# Patient Record
Sex: Female | Born: 1985 | Race: Black or African American | Hispanic: No | Marital: Single | Smoking: Current every day smoker
Health system: Southern US, Community
[De-identification: ages and names within clinical notes are randomized; demographics above are authoritative.]

## PROBLEM LIST (undated history)

## (undated) ENCOUNTER — Inpatient Hospital Stay: Payer: Self-pay

## (undated) DIAGNOSIS — L309 Dermatitis, unspecified: Secondary | ICD-10-CM

## (undated) DIAGNOSIS — J45909 Unspecified asthma, uncomplicated: Secondary | ICD-10-CM

## (undated) DIAGNOSIS — O99210 Obesity complicating pregnancy, unspecified trimester: Secondary | ICD-10-CM

## (undated) HISTORY — PX: NO PAST SURGERIES: SHX2092

---

## 2009-01-06 ENCOUNTER — Emergency Department: Payer: Self-pay | Admitting: Unknown Physician Specialty

## 2009-01-18 ENCOUNTER — Encounter: Payer: Self-pay | Admitting: Family Medicine

## 2009-01-19 ENCOUNTER — Encounter: Payer: Self-pay | Admitting: Family Medicine

## 2009-11-23 ENCOUNTER — Emergency Department: Payer: Self-pay | Admitting: Unknown Physician Specialty

## 2009-12-15 ENCOUNTER — Emergency Department: Payer: Self-pay | Admitting: Internal Medicine

## 2010-03-04 ENCOUNTER — Emergency Department: Payer: Self-pay | Admitting: Emergency Medicine

## 2010-03-06 ENCOUNTER — Emergency Department: Payer: Self-pay | Admitting: Emergency Medicine

## 2010-06-01 ENCOUNTER — Emergency Department: Payer: Self-pay | Admitting: Emergency Medicine

## 2010-07-18 ENCOUNTER — Emergency Department: Payer: Self-pay | Admitting: Emergency Medicine

## 2010-08-24 ENCOUNTER — Inpatient Hospital Stay (HOSPITAL_COMMUNITY)
Admission: AD | Admit: 2010-08-24 | Discharge: 2010-08-24 | Disposition: A | Discharge status: Home / Self Care | Payer: Self-pay | Source: Ambulatory Visit | Attending: Obstetrics & Gynecology | Admitting: Obstetrics & Gynecology

## 2010-08-24 DIAGNOSIS — R110 Nausea: Secondary | ICD-10-CM | POA: Insufficient documentation

## 2010-08-24 LAB — URINALYSIS, ROUTINE W REFLEX MICROSCOPIC
Glucose, UA: NEGATIVE mg/dL
Specific Gravity, Urine: 1.025 (ref 1.005–1.030)
pH: 7 (ref 5.0–8.0)

## 2010-09-16 ENCOUNTER — Emergency Department (HOSPITAL_COMMUNITY)
Admission: EM | Admit: 2010-09-16 | Discharge: 2010-09-16 | Disposition: A | Discharge status: Home / Self Care | Payer: Self-pay | Attending: Emergency Medicine | Admitting: Emergency Medicine

## 2010-09-16 DIAGNOSIS — N644 Mastodynia: Secondary | ICD-10-CM | POA: Insufficient documentation

## 2010-09-16 DIAGNOSIS — M545 Low back pain, unspecified: Secondary | ICD-10-CM | POA: Insufficient documentation

## 2010-09-16 LAB — URINALYSIS, ROUTINE W REFLEX MICROSCOPIC
Bilirubin Urine: NEGATIVE
Nitrite: NEGATIVE
Specific Gravity, Urine: 1.019 (ref 1.005–1.030)
Urobilinogen, UA: 0.2 mg/dL (ref 0.0–1.0)

## 2010-09-16 LAB — URINE MICROSCOPIC-ADD ON

## 2010-09-16 LAB — PREGNANCY, URINE: Preg Test, Ur: NEGATIVE

## 2010-09-30 ENCOUNTER — Emergency Department (HOSPITAL_COMMUNITY): Payer: Self-pay

## 2010-09-30 ENCOUNTER — Emergency Department (HOSPITAL_COMMUNITY)
Admission: EM | Admit: 2010-09-30 | Discharge: 2010-09-30 | Disposition: A | Discharge status: Home / Self Care | Payer: Self-pay | Attending: Emergency Medicine | Admitting: Emergency Medicine

## 2010-09-30 DIAGNOSIS — S3210XA Unspecified fracture of sacrum, initial encounter for closed fracture: Secondary | ICD-10-CM | POA: Insufficient documentation

## 2010-09-30 DIAGNOSIS — M549 Dorsalgia, unspecified: Secondary | ICD-10-CM | POA: Insufficient documentation

## 2010-09-30 DIAGNOSIS — X58XXXA Exposure to other specified factors, initial encounter: Secondary | ICD-10-CM | POA: Insufficient documentation

## 2010-09-30 DIAGNOSIS — R109 Unspecified abdominal pain: Secondary | ICD-10-CM | POA: Insufficient documentation

## 2010-09-30 LAB — URINALYSIS, ROUTINE W REFLEX MICROSCOPIC
Glucose, UA: NEGATIVE mg/dL
Hgb urine dipstick: NEGATIVE
Protein, ur: NEGATIVE mg/dL

## 2010-11-02 ENCOUNTER — Emergency Department: Payer: Self-pay | Admitting: Emergency Medicine

## 2010-11-28 ENCOUNTER — Emergency Department (HOSPITAL_COMMUNITY)
Admission: EM | Admit: 2010-11-28 | Discharge: 2010-11-28 | Disposition: A | Discharge status: Home / Self Care | Payer: Self-pay | Attending: Emergency Medicine | Admitting: Emergency Medicine

## 2010-11-28 DIAGNOSIS — J039 Acute tonsillitis, unspecified: Secondary | ICD-10-CM | POA: Insufficient documentation

## 2010-11-28 DIAGNOSIS — R070 Pain in throat: Secondary | ICD-10-CM | POA: Insufficient documentation

## 2010-11-28 DIAGNOSIS — J351 Hypertrophy of tonsils: Secondary | ICD-10-CM | POA: Insufficient documentation

## 2010-11-28 DIAGNOSIS — R599 Enlarged lymph nodes, unspecified: Secondary | ICD-10-CM | POA: Insufficient documentation

## 2010-11-28 LAB — POCT PREGNANCY, URINE: Preg Test, Ur: NEGATIVE

## 2010-12-16 ENCOUNTER — Emergency Department (HOSPITAL_COMMUNITY)
Admission: EM | Admit: 2010-12-16 | Discharge: 2010-12-16 | Disposition: A | Discharge status: Home / Self Care | Payer: Self-pay | Attending: Emergency Medicine | Admitting: Emergency Medicine

## 2010-12-16 DIAGNOSIS — L02419 Cutaneous abscess of limb, unspecified: Secondary | ICD-10-CM | POA: Insufficient documentation

## 2010-12-16 DIAGNOSIS — L03119 Cellulitis of unspecified part of limb: Secondary | ICD-10-CM | POA: Insufficient documentation

## 2011-01-02 ENCOUNTER — Emergency Department: Payer: Self-pay | Admitting: Emergency Medicine

## 2011-05-20 ENCOUNTER — Emergency Department: Payer: Self-pay | Admitting: *Deleted

## 2011-06-20 ENCOUNTER — Emergency Department: Payer: Self-pay | Admitting: Emergency Medicine

## 2012-03-25 IMAGING — CR DG SACRUM/COCCYX 2+V
3 series · 3 of 3 positions shown · non-contrast
Comparison: None.

CLINICAL DATA: Back pain.

SACRUM AND COCCYX - 2+ VIEW

[t sacrum a.p.]
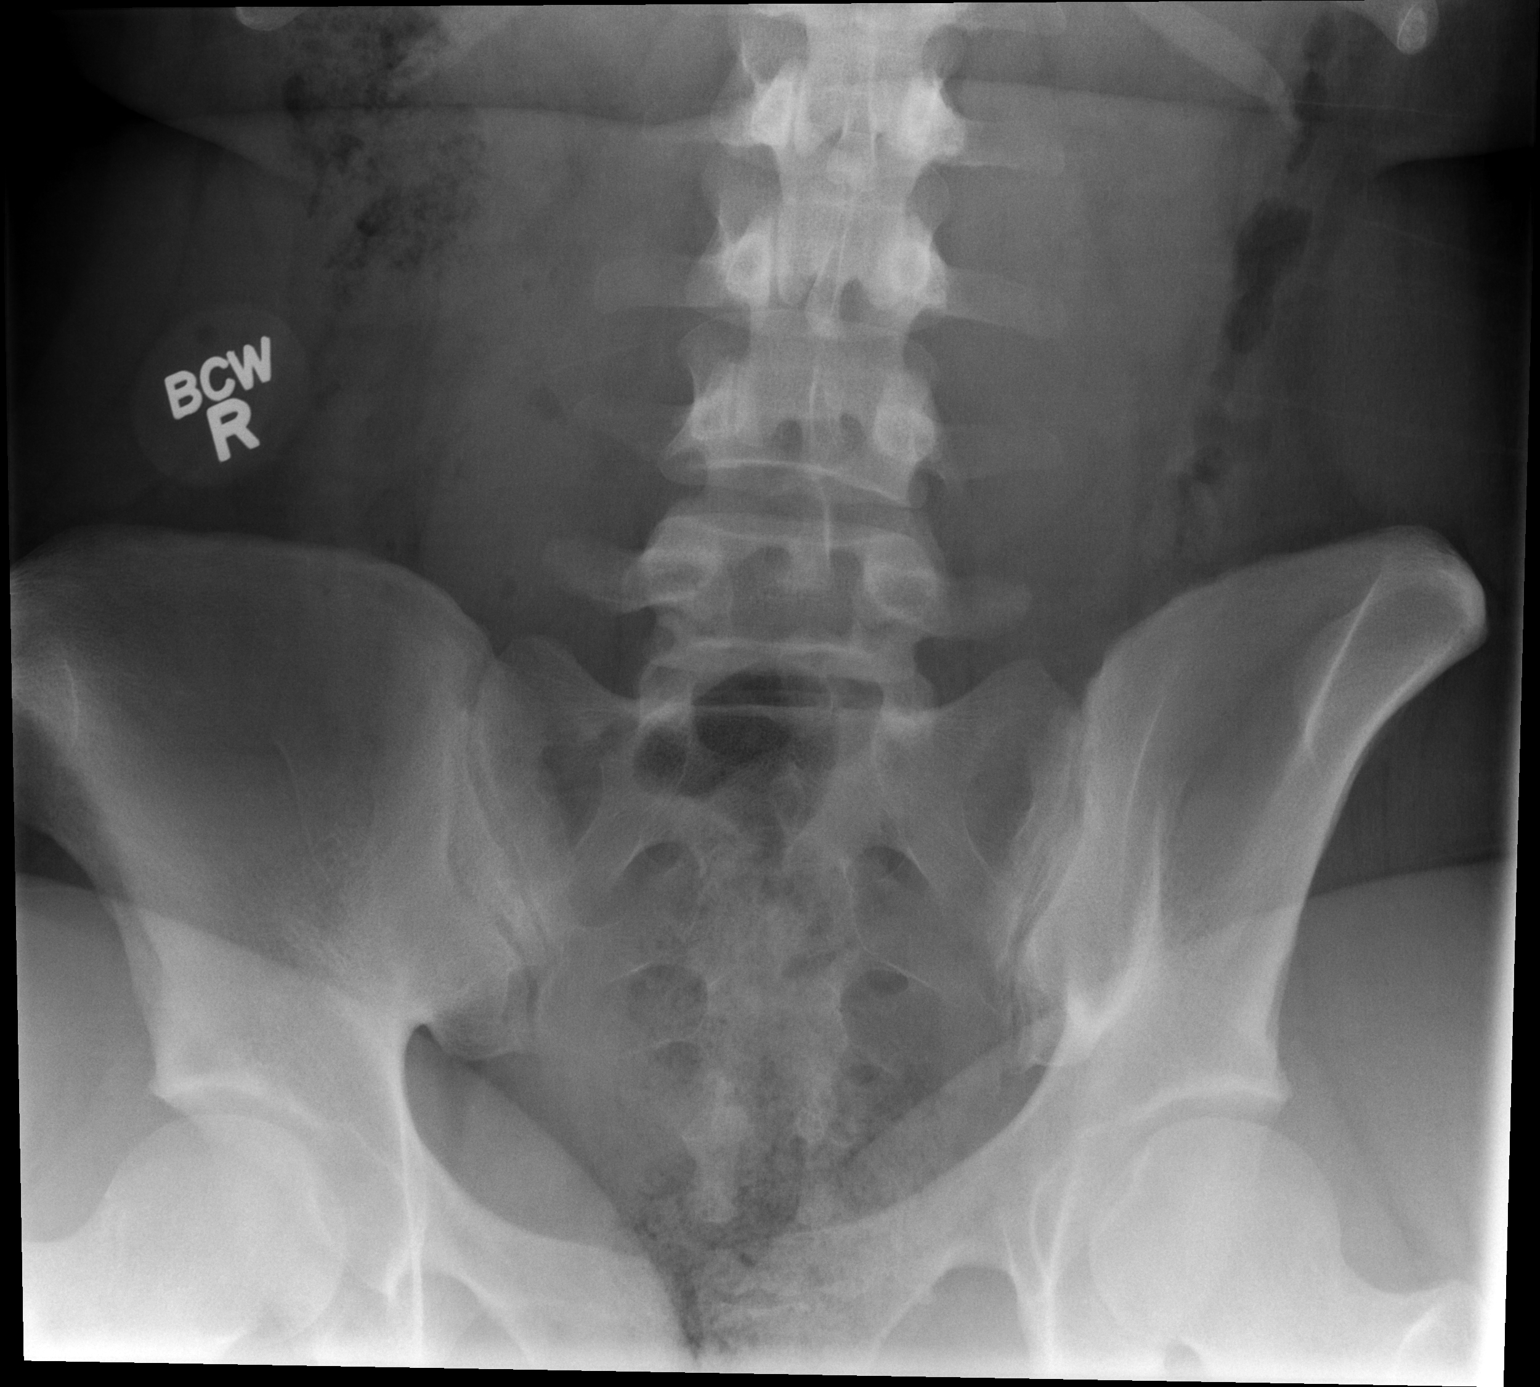

[t coccyx a.p.]
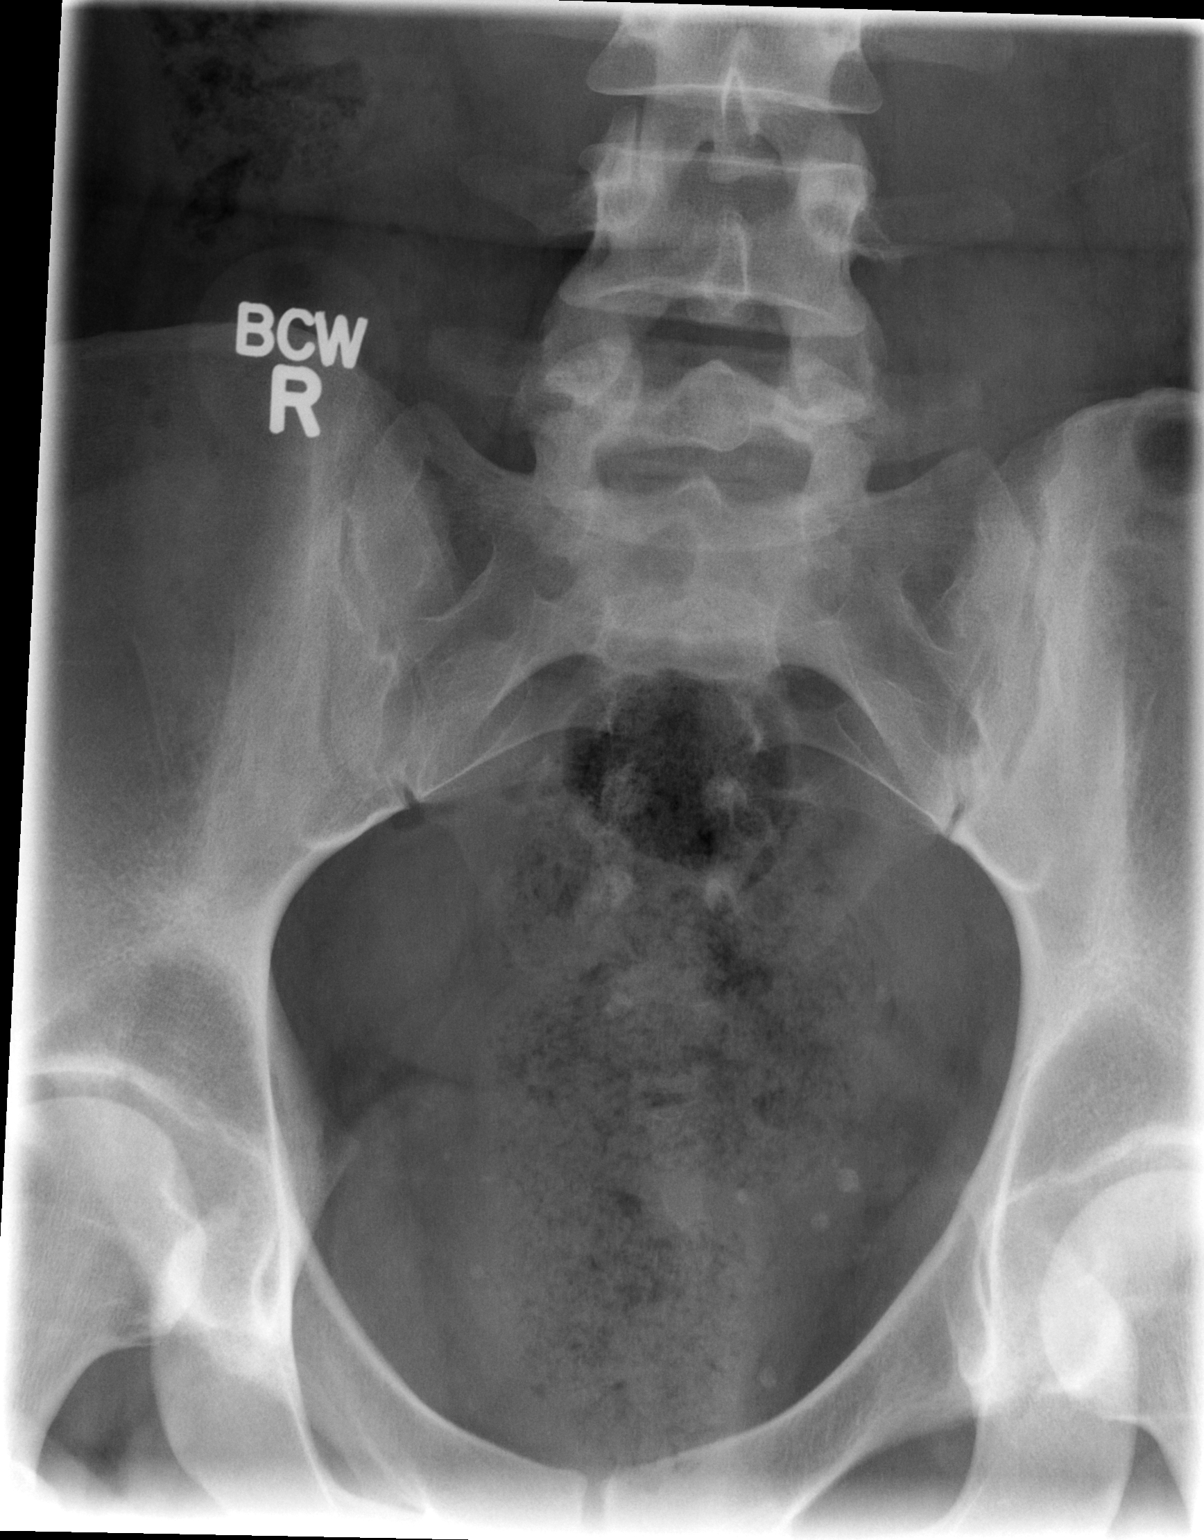

[t sacrum lat]
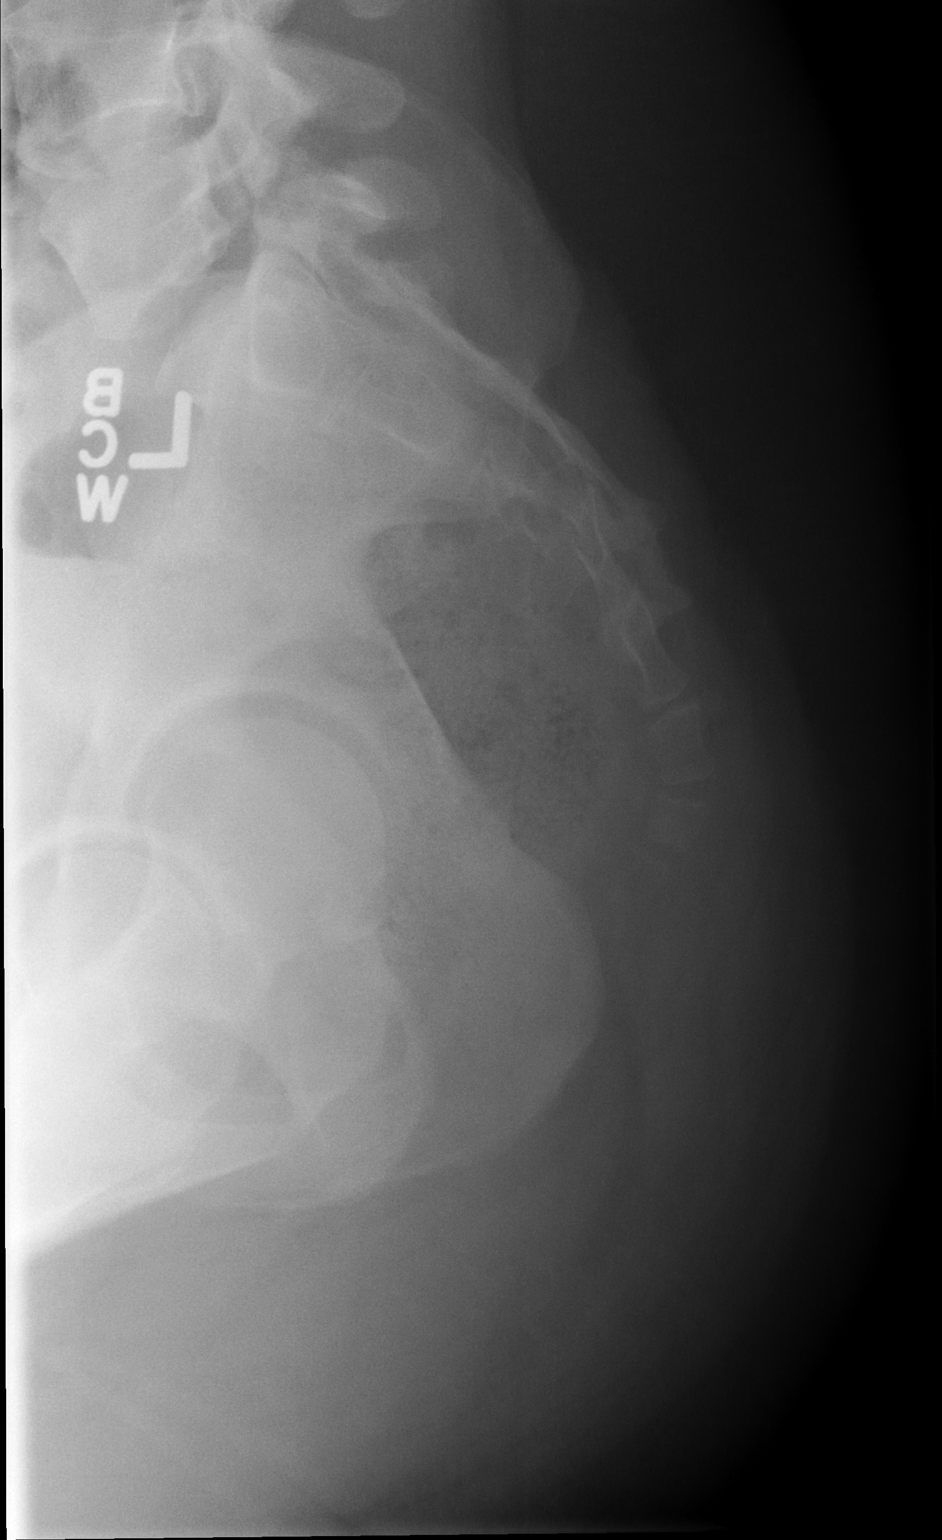

[3 of 3 positions shown; findings below may reference images not displayed]

FINDINGS: On the frontal view, the sacrum is largely obscured by
bowel contents.  On the lateral view, there is questionable
cortical offset in the lower sacrum.
IMPRESSION: Very questionable cortical offset of the lower sacrum.  Please
correlate clinically.  If symptoms persist, follow-up evaluation
with MR pelvis without contrast could be performed.

## 2013-11-02 ENCOUNTER — Emergency Department: Payer: Self-pay | Admitting: Emergency Medicine

## 2013-11-07 ENCOUNTER — Emergency Department: Payer: Self-pay | Admitting: Emergency Medicine

## 2014-01-01 ENCOUNTER — Emergency Department: Payer: Self-pay | Admitting: Emergency Medicine

## 2014-01-01 LAB — WET PREP, GENITAL

## 2014-01-01 LAB — GC/CHLAMYDIA PROBE AMP

## 2014-02-05 ENCOUNTER — Emergency Department: Payer: Self-pay | Admitting: Student

## 2014-04-21 NOTE — L&D Delivery Note (Addendum)
Obstetrical Delivery Note   Date of Delivery:   12/19/2014 Primary OB:   [facility] Gestational Age/EDD: [redacted]w[redacted]d (Dated by LMP=18 week ultrasound) Antepartum complications: gestational HTN, insufficient prenatal care, history of THC use during pregnancy, BMI 36  Delivered By:   Cornelia Copa. MD  Delivery Type:   spontaneous vaginal delivery  Delivery Details:   CTSP for fetus crowning and upon entry into room, fetus crowning with RN at the bedside. Patient pushed and nuchal cord x 1 reduced and rest of body then delivered easily. Cord cut and clamped x 2 and handed to awaiting Peds Team.  Placenta out via active third stage and no laceration.  Anesthesia:    epidural Intrapartum complications: None GBS:    Unknown; swab from 8/29 pending and patient received ampicillin x 2 during labor Laceration:    none Episiotomy:    none Placenta:    Delivered and expressed via active management. Intact: yes. To pathology: yes.  Estimated Blood Loss:   Baby:    Liveborn female, APGARs 8/9, weight 3800gm  Cornelia Copa. MD [employer] Pager [phone]

## 2014-05-05 ENCOUNTER — Emergency Department: Payer: Self-pay | Admitting: Emergency Medicine

## 2014-05-05 LAB — WET PREP, GENITAL

## 2014-05-05 LAB — OB RESULTS CONSOLE GC/CHLAMYDIA
Chlamydia: NEGATIVE
Gonorrhea: NEGATIVE

## 2014-05-05 LAB — GC/CHLAMYDIA PROBE AMP

## 2014-07-26 LAB — OB RESULTS CONSOLE RUBELLA ANTIBODY, IGM: RUBELLA: IMMUNE

## 2014-07-26 LAB — OB RESULTS CONSOLE HEPATITIS B SURFACE ANTIGEN: Hepatitis B Surface Ag: NEGATIVE

## 2014-07-26 LAB — OB RESULTS CONSOLE VARICELLA ZOSTER ANTIBODY, IGG: Varicella: IMMUNE

## 2014-07-26 LAB — OB RESULTS CONSOLE HIV ANTIBODY (ROUTINE TESTING): HIV: NONREACTIVE

## 2014-07-26 LAB — OB RESULTS CONSOLE RPR: RPR: NONREACTIVE

## 2014-08-25 ENCOUNTER — Other Ambulatory Visit: Payer: Self-pay | Admitting: Obstetrics and Gynecology

## 2014-08-28 ENCOUNTER — Other Ambulatory Visit: Payer: Self-pay | Admitting: Obstetrics and Gynecology

## 2014-08-28 DIAGNOSIS — IMO0002 Reserved for concepts with insufficient information to code with codable children: Secondary | ICD-10-CM

## 2014-08-28 DIAGNOSIS — O351XX0 Maternal care for (suspected) chromosomal abnormality in fetus, not applicable or unspecified: Secondary | ICD-10-CM

## 2014-09-04 ENCOUNTER — Ambulatory Visit: Payer: Self-pay

## 2014-09-07 ENCOUNTER — Ambulatory Visit: Payer: Self-pay

## 2014-09-14 ENCOUNTER — Ambulatory Visit
Admission: RE | Admit: 2014-09-14 | Discharge: 2014-09-14 | Disposition: A | Discharge status: Home / Self Care | Payer: Medicaid Other | Source: Ambulatory Visit | Attending: Maternal & Fetal Medicine | Admitting: Maternal & Fetal Medicine

## 2014-09-14 ENCOUNTER — Ambulatory Visit
Admission: RE | Admit: 2014-09-14 | Discharge: 2014-09-14 | Disposition: A | Discharge status: Home / Self Care | Payer: Medicaid Other | Source: Ambulatory Visit | Attending: Obstetrics and Gynecology | Admitting: Obstetrics and Gynecology

## 2014-09-14 DIAGNOSIS — O351XX0 Maternal care for (suspected) chromosomal abnormality in fetus, not applicable or unspecified: Secondary | ICD-10-CM | POA: Diagnosis present

## 2014-09-14 DIAGNOSIS — IMO0002 Reserved for concepts with insufficient information to code with codable children: Secondary | ICD-10-CM

## 2014-09-14 LAB — US OB DETAIL + 14 WK

## 2014-10-18 LAB — OB RESULTS CONSOLE RPR: RPR: NONREACTIVE

## 2014-10-18 LAB — OB RESULTS CONSOLE HIV ANTIBODY (ROUTINE TESTING): HIV: NONREACTIVE

## 2014-12-18 ENCOUNTER — Encounter: Payer: Self-pay | Admitting: *Deleted

## 2014-12-18 ENCOUNTER — Observation Stay
Admission: EM | Admit: 2014-12-18 | Discharge: 2014-12-18 | Disposition: A | Discharge status: Home / Self Care | Payer: Medicaid Other | Source: Home / Self Care | Admitting: Obstetrics & Gynecology

## 2014-12-18 DIAGNOSIS — Z3A39 39 weeks gestation of pregnancy: Secondary | ICD-10-CM | POA: Insufficient documentation

## 2014-12-18 DIAGNOSIS — O99214 Obesity complicating childbirth: Principal | ICD-10-CM | POA: Diagnosis present

## 2014-12-18 DIAGNOSIS — D693 Immune thrombocytopenic purpura: Secondary | ICD-10-CM | POA: Diagnosis present

## 2014-12-18 DIAGNOSIS — O133 Gestational [pregnancy-induced] hypertension without significant proteinuria, third trimester: Secondary | ICD-10-CM | POA: Diagnosis present

## 2014-12-18 DIAGNOSIS — O0933 Supervision of pregnancy with insufficient antenatal care, third trimester: Secondary | ICD-10-CM

## 2014-12-18 DIAGNOSIS — O26893 Other specified pregnancy related conditions, third trimester: Secondary | ICD-10-CM

## 2014-12-18 DIAGNOSIS — O169 Unspecified maternal hypertension, unspecified trimester: Secondary | ICD-10-CM | POA: Diagnosis present

## 2014-12-18 DIAGNOSIS — O99320 Drug use complicating pregnancy, unspecified trimester: Secondary | ICD-10-CM | POA: Diagnosis present

## 2014-12-18 DIAGNOSIS — Z87891 Personal history of nicotine dependence: Secondary | ICD-10-CM

## 2014-12-18 DIAGNOSIS — Z8261 Family history of arthritis: Secondary | ICD-10-CM

## 2014-12-18 DIAGNOSIS — R030 Elevated blood-pressure reading, without diagnosis of hypertension: Secondary | ICD-10-CM | POA: Insufficient documentation

## 2014-12-18 DIAGNOSIS — Z6833 Body mass index (BMI) 33.0-33.9, adult: Secondary | ICD-10-CM

## 2014-12-18 DIAGNOSIS — Z79899 Other long term (current) drug therapy: Secondary | ICD-10-CM

## 2014-12-18 DIAGNOSIS — F1290 Cannabis use, unspecified, uncomplicated: Secondary | ICD-10-CM | POA: Diagnosis present

## 2014-12-18 LAB — COMPREHENSIVE METABOLIC PANEL
ALBUMIN: 2.7 g/dL — AB (ref 3.5–5.0)
ALT: 22 U/L (ref 14–54)
ANION GAP: 6 (ref 5–15)
AST: 24 U/L (ref 15–41)
Alkaline Phosphatase: 170 U/L — ABNORMAL HIGH (ref 38–126)
BILIRUBIN TOTAL: 0.7 mg/dL (ref 0.3–1.2)
BUN: 6 mg/dL (ref 6–20)
CO2: 23 mmol/L (ref 22–32)
Calcium: 8.7 mg/dL — ABNORMAL LOW (ref 8.9–10.3)
Chloride: 104 mmol/L (ref 101–111)
Creatinine, Ser: 0.7 mg/dL (ref 0.44–1.00)
GFR calc Af Amer: >60 mL/min (ref >60)
GFR calc non Af Amer: >60 mL/min (ref >60)
GLUCOSE: 87 mg/dL (ref 65–99)
POTASSIUM: 4.2 mmol/L (ref 3.5–5.1)
SODIUM: 133 mmol/L — AB (ref 135–145)
TOTAL PROTEIN: 6.8 g/dL (ref 6.5–8.1)

## 2014-12-18 LAB — URINE DRUG SCREEN, QUALITATIVE (ARMC ONLY)
Amphetamines, Ur Screen: NOT DETECTED
BARBITURATES, UR SCREEN: NOT DETECTED
BENZODIAZEPINE, UR SCRN: NOT DETECTED
Cannabinoid 50 Ng, Ur ~~LOC~~: POSITIVE — AB
Cocaine Metabolite,Ur ~~LOC~~: NOT DETECTED
MDMA (Ecstasy)Ur Screen: NOT DETECTED
METHADONE SCREEN, URINE: NOT DETECTED
Opiate, Ur Screen: NOT DETECTED
Phencyclidine (PCP) Ur S: NOT DETECTED
TRICYCLIC, UR SCREEN: NOT DETECTED

## 2014-12-18 LAB — CBC
HEMATOCRIT: 37.2 % (ref 35.0–47.0)
Hemoglobin: 12.3 g/dL (ref 12.0–16.0)
MCH: 29.6 pg (ref 26.0–34.0)
MCHC: 33 g/dL (ref 32.0–36.0)
MCV: 89.8 fL (ref 80.0–100.0)
PLATELETS: 282 10*3/uL (ref 150–440)
RBC: 4.14 MIL/uL (ref 3.80–5.20)
RDW: 14.2 % (ref 11.5–14.5)
WBC: 14.3 10*3/uL — AB (ref 3.6–11.0)

## 2014-12-18 LAB — URIC ACID: URIC ACID, SERUM: 3.9 mg/dL (ref 2.3–6.6)

## 2014-12-18 LAB — PROTEIN / CREATININE RATIO, URINE
CREATININE, URINE: 264 mg/dL
PROTEIN CREATININE RATIO: 0.09 mg/mg{creat} (ref 0.00–0.15)
Total Protein, Urine: 23 mg/dL

## 2014-12-18 MED ORDER — ACETAMINOPHEN 325 MG PO TABS: 650.0000 mg | ORAL_TABLET | ORAL | Status: DC | PRN

## 2014-12-18 NOTE — Discharge Instructions (Signed)
Make sure you drink plenty of fluids and get plenty of rest.   Please follow up with [facility].

## 2014-12-18 NOTE — Discharge Summary (Signed)
Obstetric History and Physical  Laurie Stewart is a 29 y.o. G2P0 with Estimated Date of Delivery: 12/25/14 per LMP at 18 wk [place] who presents at [redacted]w[redacted]d  presenting for evaluation of BP after BP of 140/90 and 143/98 at office today. Patient states she has been having rare contractions, no vaginal bleeding, intact membranes, with active fetal movement.    Prenatal Course Source of Care: [facility] with onset of care at 15 weeks Pregnancy complications or risks: BMI 33, late to care with lapse of care between 30-39 weeks (pt states she kept forgetting or sleeping through appts d/t working night shift.) Slightly increased Nuchal fold - negative informaseq Patient Active Problem List   Diagnosis Date Noted  . Hypertension affecting pregnancy 12/18/2014    Prenatal labs and studies: ABO, Rh: AB+  Antibody: neg Rubella: Immune Varicella: Immune RPR:  Non-reactive HBsAg:  Negative HIV: Neg GC/CT: neg/neg GBS: drawn today 1 hr Glucola: 70   Genetic screening: negative Informaseq    Prenatal Transfer Tool   PMHx: Eczema  History reviewed. No pertinent past surgical history.  OB History  Gravida Para Term Preterm AB SAB TAB Ectopic Multiple Living  2         1    # Outcome Date GA Lbr Len/2nd Weight Sex Delivery Anes PTL Lv  2 Current           1 Gravida 09/21/02    M Vag-Spont  N Y      Social History   Social History  . Marital Status: Single    Spouse Name: N/A  . Number of Children: N/A  . Years of Education: N/A   Social History Main Topics  . Smoking status: Former Smoker    Quit date: 05/19/2014  . Smokeless tobacco: None  . Alcohol Use: No  . Drug Use: No  . Sexual Activity: Yes    Birth Control/ Protection: Injection   Other Topics Concern  . None   Social History Narrative    Family History  Problem Relation Age of Onset  . Arthritis Father     Prescriptions prior to admission  Medication Sig Dispense Refill Last Dose  . Prenatal Vit-Fe Fumarate-FA  (MULTIVITAMIN-PRENATAL) 27-0.8 MG TABS tablet Take 1 tablet by mouth daily at 12 noon.     Prevacid  No Known Allergies  Review of Systems: Negative except for what is mentioned in HPI.  Physical Exam: BP 109-137/60-94, Diastolic >90 x 2, otherwise all values normotensive  Pulse 73 GENERAL: Well-developed, well-nourished female in no acute distress.  ABDOMEN: Soft, nontender, nondistended, gravid. EXTREMITIES: Nontender, no edema Cervical Exam: Dilatation 2 cm   Effacement 50 %   Station high,    Presentation: cephalic confirmed with bedside US FHT: Category: 1 Baseline rate 135 bpm   Variability moderate  Accelerations present   Decelerations none Contractions: Every rare mins   Pertinent Labs/Studies:   Results for orders placed or performed during the hospital encounter of 12/18/14 (from the past 24 hour(s))  Protein / creatinine ratio, urine     Status: None   Collection Time: 12/18/14  1:00 PM  Result Value Ref Range   Creatinine, Urine 264 mg/dL   Total Protein, Urine 23 mg/dL   Protein Creatinine Ratio 0.09 0.00 - 0.15 mg/mg[Cre]  Urine Drug Screen, Qualitative ([facility] only)     Status: Abnormal   Collection Time: 12/18/14  1:00 PM  Result Value Ref Range   Tricyclic, Ur Screen NONE DETECTED NONE DETECTED  Amphetamines, Ur Screen NONE DETECTED NONE DETECTED   MDMA (Ecstasy)Ur Screen NONE DETECTED NONE DETECTED   Cocaine Metabolite,Ur [place] NONE DETECTED NONE DETECTED   Opiate, Ur Screen NONE DETECTED NONE DETECTED   Phencyclidine (PCP) Ur S NONE DETECTED NONE DETECTED   Cannabinoid 50 Ng, Ur [place] POSITIVE (A) NONE DETECTED   Barbiturates, Ur Screen NONE DETECTED NONE DETECTED   Benzodiazepine, Ur Scrn NONE DETECTED NONE DETECTED   Methadone Scn, Ur NONE DETECTED NONE DETECTED  Comprehensive metabolic panel     Status: Abnormal   Collection Time: 12/18/14  1:18 PM  Result Value Ref Range   Sodium 133 (L) 135 - 145 mmol/L   Potassium 4.2 3.5 - 5.1 mmol/L   Chloride 104 101  - 111 mmol/L   CO2 23 22 - 32 mmol/L   Glucose, Bld 87 65 - 99 mg/dL   BUN 6 6 - 20 mg/dL   Creatinine, Ser 0.98 0.44 - 1.00 mg/dL   Calcium 8.7 (L) 8.9 - 10.3 mg/dL   Total Protein 6.8 6.5 - 8.1 g/dL   Albumin 2.7 (L) 3.5 - 5.0 g/dL   AST 24 15 - 41 U/L   ALT 22 14 - 54 U/L   Alkaline Phosphatase 170 (H) 38 - 126 U/L   Total Bilirubin 0.7 0.3 - 1.2 mg/dL   GFR calc non Af Amer >60 >60 mL/min   GFR calc Af Amer >60 >60 mL/min   Anion gap 6 5 - 15  CBC     Status: Abnormal   Collection Time: 12/18/14  1:18 PM  Result Value Ref Range   WBC 14.3 (H) 3.6 - 11.0 K/uL   RBC 4.14 3.80 - 5.20 MIL/uL   Hemoglobin 12.3 12.0 - 16.0 g/dL   HCT 11.9 14.7 - 82.9 %   MCV 89.8 80.0 - 100.0 fL   MCH 29.6 26.0 - 34.0 pg   MCHC 33.0 32.0 - 36.0 g/dL   RDW 56.2 13.0 - 86.5 %   Platelets 282 150 - 440 K/uL  Uric acid     Status: None   Collection Time: 12/18/14  1:18 PM  Result Value Ref Range   Uric Acid, Serum 3.9 2.3 - 6.6 mg/dL    Assessment : IUP at [redacted]w[redacted]d without evidence of GHTN or pre-eclampsia  Plan: Discharge Home  F/u in office on 9/1 or 9/2 (already has appt on 9/7) Pre-eclampsia and labor precautions

## 2014-12-18 NOTE — OB Triage Note (Addendum)
Patient with minimal prenatal care sent in from [facility] for high blood pressure

## 2014-12-18 NOTE — Discharge Summary (Signed)
Pt d/c'd to home. VS WNL, no c/o pain. FHR reassuring. Given d/c instructions, verbalized understanding. States she will make follow up appointment at [facility] for BP check.

## 2014-12-19 ENCOUNTER — Inpatient Hospital Stay
Admission: EM | Admit: 2014-12-19 | Discharge: 2014-12-21 | DRG: 775 | Disposition: A | Discharge status: Home / Self Care | Payer: Medicaid Other | Attending: Obstetrics and Gynecology | Admitting: Obstetrics and Gynecology

## 2014-12-19 ENCOUNTER — Inpatient Hospital Stay: Payer: Medicaid Other | Admitting: Anesthesiology

## 2014-12-19 ENCOUNTER — Encounter: Payer: Self-pay | Admitting: *Deleted

## 2014-12-19 DIAGNOSIS — Z3A39 39 weeks gestation of pregnancy: Secondary | ICD-10-CM | POA: Diagnosis present

## 2014-12-19 DIAGNOSIS — O133 Gestational [pregnancy-induced] hypertension without significant proteinuria, third trimester: Secondary | ICD-10-CM | POA: Diagnosis present

## 2014-12-19 DIAGNOSIS — Z8261 Family history of arthritis: Secondary | ICD-10-CM | POA: Diagnosis not present

## 2014-12-19 DIAGNOSIS — F1290 Cannabis use, unspecified, uncomplicated: Secondary | ICD-10-CM | POA: Diagnosis present

## 2014-12-19 DIAGNOSIS — Z6833 Body mass index (BMI) 33.0-33.9, adult: Secondary | ICD-10-CM | POA: Diagnosis not present

## 2014-12-19 DIAGNOSIS — Z79899 Other long term (current) drug therapy: Secondary | ICD-10-CM | POA: Diagnosis not present

## 2014-12-19 DIAGNOSIS — O99214 Obesity complicating childbirth: Secondary | ICD-10-CM | POA: Diagnosis present

## 2014-12-19 DIAGNOSIS — D693 Immune thrombocytopenic purpura: Secondary | ICD-10-CM | POA: Diagnosis present

## 2014-12-19 DIAGNOSIS — O99320 Drug use complicating pregnancy, unspecified trimester: Secondary | ICD-10-CM | POA: Diagnosis present

## 2014-12-19 DIAGNOSIS — Z87891 Personal history of nicotine dependence: Secondary | ICD-10-CM | POA: Diagnosis not present

## 2014-12-19 DIAGNOSIS — O0933 Supervision of pregnancy with insufficient antenatal care, third trimester: Secondary | ICD-10-CM | POA: Diagnosis not present

## 2014-12-19 HISTORY — DX: Dermatitis, unspecified: L30.9

## 2014-12-19 HISTORY — DX: Obesity complicating pregnancy, unspecified trimester: O99.210

## 2014-12-19 LAB — PROTEIN / CREATININE RATIO, URINE
CREATININE, URINE: 107 mg/dL
Protein Creatinine Ratio: 0.14 mg/mg{Cre} (ref 0.00–0.15)
Total Protein, Urine: 15 mg/dL

## 2014-12-19 LAB — CBC
HCT: 37.6 % (ref 35.0–47.0)
Hemoglobin: 12.1 g/dL (ref 12.0–16.0)
MCH: 29 pg (ref 26.0–34.0)
MCHC: 32.1 g/dL (ref 32.0–36.0)
MCV: 90.3 fL (ref 80.0–100.0)
PLATELETS: 269 10*3/uL (ref 150–440)
RBC: 4.17 MIL/uL (ref 3.80–5.20)
RDW: 14.5 % (ref 11.5–14.5)
WBC: 13.3 10*3/uL — AB (ref 3.6–11.0)

## 2014-12-19 LAB — COMPREHENSIVE METABOLIC PANEL
ALBUMIN: 2.7 g/dL — AB (ref 3.5–5.0)
ALT: 20 U/L (ref 14–54)
AST: 29 U/L (ref 15–41)
Alkaline Phosphatase: 163 U/L — ABNORMAL HIGH (ref 38–126)
Anion gap: 7 (ref 5–15)
BUN: 5 mg/dL — AB (ref 6–20)
CHLORIDE: 108 mmol/L (ref 101–111)
CO2: 23 mmol/L (ref 22–32)
Calcium: 8.7 mg/dL — ABNORMAL LOW (ref 8.9–10.3)
Creatinine, Ser: 0.54 mg/dL (ref 0.44–1.00)
GFR calc Af Amer: >60 mL/min (ref >60)
GLUCOSE: 95 mg/dL (ref 65–99)
POTASSIUM: 4.2 mmol/L (ref 3.5–5.1)
Sodium: 138 mmol/L (ref 135–145)
Total Bilirubin: 0.3 mg/dL (ref 0.3–1.2)
Total Protein: 6.5 g/dL (ref 6.5–8.1)

## 2014-12-19 LAB — TYPE AND SCREEN
ABO/RH(D): AB POS
ANTIBODY SCREEN: NEGATIVE

## 2014-12-19 LAB — ABO/RH: ABO/RH(D): AB POS

## 2014-12-19 MED ORDER — IBUPROFEN 600 MG PO TABS
600.0000 mg | ORAL_TABLET | Freq: Four times a day (QID) | ORAL | Status: DC
  Administered 2014-12-20 – 2014-12-21 (×6): 600 mg via ORAL
  Filled 2014-12-19 (×6): qty 1

## 2014-12-19 MED ORDER — OXYTOCIN 40 UNITS IN LACTATED RINGERS INFUSION - SIMPLE MED
1.0000 m[IU]/min | INTRAVENOUS | Status: DC
  Administered 2014-12-19: 1 m[IU]/min via INTRAVENOUS

## 2014-12-19 MED ORDER — PHENYLEPHRINE 40 MCG/ML (10ML) SYRINGE FOR IV PUSH (FOR BLOOD PRESSURE SUPPORT)
80.0000 ug | PREFILLED_SYRINGE | INTRAVENOUS | Status: DC | PRN
  Filled 2014-12-19: qty 2

## 2014-12-19 MED ORDER — AMMONIA AROMATIC IN INHA
RESPIRATORY_TRACT | Status: AC
  Filled 2014-12-19: qty 10

## 2014-12-19 MED ORDER — EPHEDRINE 5 MG/ML INJ
10.0000 mg | INTRAVENOUS | Status: DC | PRN
  Filled 2014-12-19: qty 2

## 2014-12-19 MED ORDER — LACTATED RINGERS IV SOLN
INTRAVENOUS | Status: DC
  Administered 2014-12-19: 1000 mL via INTRAVENOUS

## 2014-12-19 MED ORDER — SIMETHICONE 80 MG PO CHEW: 80.0000 mg | CHEWABLE_TABLET | ORAL | Status: DC | PRN

## 2014-12-19 MED ORDER — PRENATAL MULTIVITAMIN CH
1.0000 | ORAL_TABLET | Freq: Every day | ORAL | Status: DC
  Administered 2014-12-20: 1 via ORAL
  Filled 2014-12-19: qty 1

## 2014-12-19 MED ORDER — OXYTOCIN BOLUS FROM INFUSION
500.0000 mL | INTRAVENOUS | Status: DC
  Administered 2014-12-19: 500 mL via INTRAVENOUS

## 2014-12-19 MED ORDER — OXYTOCIN 10 UNIT/ML IJ SOLN
INTRAMUSCULAR | Status: AC
  Filled 2014-12-19: qty 2

## 2014-12-19 MED ORDER — OXYCODONE-ACETAMINOPHEN 5-325 MG PO TABS
1.0000 | ORAL_TABLET | Freq: Four times a day (QID) | ORAL | Status: DC | PRN
  Administered 2014-12-20 – 2014-12-21 (×4): 1 via ORAL
  Filled 2014-12-19 (×4): qty 1

## 2014-12-19 MED ORDER — SODIUM CHLORIDE 0.9 % IV SOLN
2.0000 g | Freq: Once | INTRAVENOUS | Status: AC
  Administered 2014-12-19: 2 g via INTRAVENOUS
  Filled 2014-12-19: qty 2000

## 2014-12-19 MED ORDER — ONDANSETRON HCL 4 MG PO TABS
4.0000 mg | ORAL_TABLET | ORAL | Status: DC | PRN
Start: 2014-12-19 — End: 2014-12-21

## 2014-12-19 MED ORDER — OXYTOCIN 40 UNITS IN LACTATED RINGERS INFUSION - SIMPLE MED
62.5000 mL/h | INTRAVENOUS | Status: DC
  Administered 2014-12-19: 62.5 mL/h via INTRAVENOUS
  Filled 2014-12-19: qty 1000

## 2014-12-19 MED ORDER — LIDOCAINE HCL (PF) 1 % IJ SOLN
INTRAMUSCULAR | Status: AC
  Administered 2014-12-19: 2 mL via SUBCUTANEOUS
  Filled 2014-12-19: qty 30

## 2014-12-19 MED ORDER — ONDANSETRON HCL 4 MG/2ML IJ SOLN: 4.0000 mg | Freq: Four times a day (QID) | INTRAMUSCULAR | Status: DC | PRN

## 2014-12-19 MED ORDER — WITCH HAZEL-GLYCERIN EX PADS: 1.0000 "application " | MEDICATED_PAD | CUTANEOUS | Status: DC | PRN

## 2014-12-19 MED ORDER — LANOLIN HYDROUS EX OINT: TOPICAL_OINTMENT | CUTANEOUS | Status: DC | PRN

## 2014-12-19 MED ORDER — MISOPROSTOL 200 MCG PO TABS
ORAL_TABLET | ORAL | Status: AC
  Filled 2014-12-19: qty 4

## 2014-12-19 MED ORDER — BUTORPHANOL TARTRATE 1 MG/ML IJ SOLN
1.0000 mg | INTRAMUSCULAR | Status: DC | PRN
  Administered 2014-12-19 (×3): 1 mg via INTRAVENOUS
  Filled 2014-12-19 (×3): qty 1

## 2014-12-19 MED ORDER — LIDOCAINE-EPINEPHRINE (PF) 1.5 %-1:200000 IJ SOLN
INTRAMUSCULAR | Status: DC | PRN
  Administered 2014-12-19: 4 mL via EPIDURAL

## 2014-12-19 MED ORDER — ONDANSETRON HCL 4 MG/2ML IJ SOLN: 4.0000 mg | INTRAMUSCULAR | Status: DC | PRN

## 2014-12-19 MED ORDER — ACETAMINOPHEN 325 MG PO TABS: 650.0000 mg | ORAL_TABLET | ORAL | Status: DC | PRN

## 2014-12-19 MED ORDER — DIPHENHYDRAMINE HCL 50 MG/ML IJ SOLN: 12.5000 mg | INTRAMUSCULAR | Status: DC | PRN

## 2014-12-19 MED ORDER — BUPIVACAINE HCL (PF) 0.25 % IJ SOLN
INTRAMUSCULAR | Status: DC | PRN
  Administered 2014-12-19: 5 mL

## 2014-12-19 MED ORDER — TERBUTALINE SULFATE 1 MG/ML IJ SOLN: 0.2500 mg | Freq: Once | INTRAMUSCULAR | Status: DC | PRN

## 2014-12-19 MED ORDER — DOCUSATE SODIUM 100 MG PO CAPS: 100.0000 mg | ORAL_CAPSULE | Freq: Two times a day (BID) | ORAL | Status: DC | PRN

## 2014-12-19 MED ORDER — FENTANYL 2.5 MCG/ML W/ROPIVACAINE 0.2% IN NS 100 ML EPIDURAL INFUSION (ARMC-ANES)
EPIDURAL | Status: AC
  Filled 2014-12-19: qty 100

## 2014-12-19 MED ORDER — BENZOCAINE-MENTHOL 20-0.5 % EX AERO: 1.0000 "application " | INHALATION_SPRAY | CUTANEOUS | Status: DC | PRN

## 2014-12-19 MED ORDER — FENTANYL 2.5 MCG/ML W/ROPIVACAINE 0.2% IN NS 100 ML EPIDURAL INFUSION (ARMC-ANES)
9.0000 mL/h | EPIDURAL | Status: AC
  Administered 2014-12-19: 9 mL/h via EPIDURAL

## 2014-12-19 MED ORDER — DIPHENHYDRAMINE HCL 25 MG PO CAPS: 25.0000 mg | ORAL_CAPSULE | Freq: Four times a day (QID) | ORAL | Status: DC | PRN

## 2014-12-19 MED ORDER — DIBUCAINE 1 % RE OINT: 1.0000 "application " | TOPICAL_OINTMENT | RECTAL | Status: DC | PRN

## 2014-12-19 MED ORDER — SODIUM CHLORIDE 0.9 % IV SOLN
1.0000 g | INTRAVENOUS | Status: DC
  Administered 2014-12-19: 1 g via INTRAVENOUS
  Filled 2014-12-19 (×5): qty 1000

## 2014-12-19 NOTE — Anesthesia Procedure Notes (Signed)
Epidural Patient location during procedure: OB  Preanesthetic Checklist Completed: patient identified, site marked, surgical consent, pre-op evaluation, timeout performed, IV checked, risks and benefits discussed and monitors and equipment checked  Epidural Patient position: sitting Prep: Betadine Patient monitoring: heart rate, continuous pulse ox and blood pressure Approach: midline Location: L4-L5 Injection technique: LOR air  Needle:  Needle type: Tuohy  Needle gauge: 18 G Needle length: 9 cm and 9 Needle insertion depth: 5 cm Catheter type: closed end flexible Catheter size: 20 Guage Catheter at skin depth: 10 cm Test dose: negative and 1.5% lidocaine with Epi 1:200 K  Assessment Sensory level: T10 Events: blood not aspirated, injection not painful, no injection resistance, negative IV test and no paresthesia  Additional Notes Pt's history reviewed and consent obtained as per OB consent Patient tolerated the insertion well without complications. Negative SATD, negative IVTD All VSS were obtained and monitored through OBIX and nursing protocols followed.Reason for block:procedure for pain   

## 2014-12-19 NOTE — Progress Notes (Signed)
Pt was transferred via w/c in stable condition with baby to RM 337.

## 2014-12-19 NOTE — Progress Notes (Signed)
Pt assisted to bathroom and voided .   Asked to shower.  Pt instructed to Emergency call bell in shower.  This RN stayed at door .   Pt showered.   Clean pad with ice pack, clean breast feeding gown on.

## 2014-12-19 NOTE — Progress Notes (Addendum)
L&D Note  12/19/2014 - 3:01 PM  29 y.o. G2P1001 [redacted]w[redacted]d (EDC 9/5, LMP=18wks). Pregnancy complicated by THC positive UDS, insufficient PNC, BMI 36, gestational HTN  [name] is admitted for SROM at 1030am today   Subjective:  No s/s of pre-eclampsia. Feels q74m UCs mild to somewhat moderately intense  Objective:   Filed Vitals:   12/19/14 1121 12/19/14 1145 12/19/14 1200 12/19/14 1420  BP:  124/81  128/77  Pulse:  73  70  Temp: 97.9 F (36.6 C)   97.8 F (36.6 C)  TempSrc: Oral   Oral  Resp: 20     Height:   5' 8.5" (1.74 m)   Weight:   238 lb (107.956 kg)     Current Vital Signs 24h Vital Sign Ranges  T 97.8 F (36.6 C) Temp  Avg: 97.9 F (36.6 C)  Min: 97.8 F (36.6 C)  Max: 97.9 F (36.6 C)  BP 128/77 mmHg BP  Min: 120/92  Max: 128/77  HR 70 Pulse  Avg: 74  Min: 70  Max: 79  RR 20 Resp  Avg: 20  Min: 20  Max: 20  SaO2     No Data Recorded       24 Hour I/O Current Shift I/O  Time Ins Outs       FHR: 145 baseline, +accels, rare slight variables, mod variabiltiy Toco: q2-25m Gen: mild distress with UCs [place]: cephalic, 3600gm SVE: 3/70/-2 by RN at 1450  Labs:   Recent Labs Lab 12/18/14 1318 12/19/14 1223  WBC 14.3* 13.3*  HGB 12.3 12.1  HCT 37.2 37.6  PLT 282 269    Recent Labs Lab 12/18/14 1318 12/19/14 1223  NA 133* 138  K 4.2 4.2  CL 104 108  CO2 23 23  BUN 6 5*  CREATININE 0.70 0.54  CALCIUM 8.7* 8.7*  PROT 6.8 6.5  BILITOT 0.7 0.3  ALKPHOS 170* 163*  ALT 22 20  AST 24 29  GLUCOSE 87 95   8/29 PC ratio negative  Medications Current Facility-Administered Medications  Medication Dose Route Frequency Provider Last Rate Last Dose  . acetaminophen (TYLENOL) tablet 650 mg  650 mg Oral Q4H PRN [place] Bing, MD      . ampicillin (OMNIPEN) 1 g in sodium chloride 0.9 % 50 mL IVPB  1 g Intravenous 6 times per day [place] Bing, MD      . butorphanol (STADOL) injection 1 mg  1 mg Intravenous Q1H PRN [place] Bing, MD      .  lactated ringers infusion   Intravenous Continuous [place] Bing, MD 125 mL/hr at 12/19/14 1210 1,000 mL at 12/19/14 1210  . ondansetron (ZOFRAN) injection 4 mg  4 mg Intravenous Q6H PRN [place] Bing, MD      . oxytocin (PITOCIN) IV BOLUS FROM BAG  500 mL Intravenous Continuous [place] Bing, MD      . oxytocin (PITOCIN) IV infusion 40 units in LR 1000 mL  62.5 mL/hr Intravenous Continuous [place] Bing, MD        Assessment & Plan:  Pt stable *IUP: category II due to the recent slight variable but overall reassuring with normal baseline, +accels, moderate variability *gHTN: negative labs yesterday and negative labs today, with PC ratio not done yet. No s/s of pre-eclampsia *SROM/labor: patient amenable to adding pitocin if UCs space out and/or if no cervical change with UCs.  *h/o THC use: +UDS on admission *GBS: pending. Continue amp until negative swab comes back. Amp #2 due  at 1600 *Analgesia: IV prns ordered and may have epidural when desires.    Bing, Montez Hageman MD [facility] Pager [phone]

## 2014-12-19 NOTE — Anesthesia Preprocedure Evaluation (Signed)
Anesthesia Evaluation  Patient identified by MRN, date of birth, ID band Patient awake    Reviewed: Allergy & Precautions, H&P , NPO status , Patient's Chart, lab work & pertinent test results, reviewed documented beta blocker date and time   Airway Mallampati: III  TM Distance: >3 FB Neck ROM: full    Dental no notable dental hx. (+) Teeth Intact   Pulmonary neg pulmonary ROS, former smoker,  breath sounds clear to auscultation  Pulmonary exam normal       Cardiovascular Exercise Tolerance: Good hypertension, negative cardio ROS Normal cardiovascular examRhythm:regular Rate:Normal     Neuro/Psych negative neurological ROS  negative psych ROS   GI/Hepatic negative GI ROS, Neg liver ROS,   Endo/Other  negative endocrine ROS  Renal/GU negative Renal ROS  negative genitourinary   Musculoskeletal   Abdominal   Peds  Hematology negative hematology ROS (+)   Anesthesia Other Findings   Reproductive/Obstetrics (+) Pregnancy                             Anesthesia Physical Anesthesia Plan  ASA: II  Anesthesia Plan: Regional and Epidural   Post-op Pain Management:    Induction:   Airway Management Planned:   Additional Equipment:   Intra-op Plan:   Post-operative Plan:   Informed Consent: I have reviewed the patients History and Physical, chart, labs and discussed the procedure including the risks, benefits and alternatives for the proposed anesthesia with the patient or authorized representative who has indicated his/her understanding and acceptance.     Plan Discussed with: CRNA  Anesthesia Plan Comments:         Anesthesia Quick Evaluation

## 2014-12-19 NOTE — Progress Notes (Signed)
Pt feeling strong urge to push.  SVE Complete.  FHR down to 60's.  Pitocin off. MD called.    1922 O2 by non rebreather on.   FHR remains down to the 60's  1929 MD in room.  Pt continues to push.  Delivery by this RN at 1930 with MD direction.   Newborn cord clamped and handed off to nursery personel.

## 2014-12-19 NOTE — H&P (Signed)
OB History & Physical   History of Present Illness:  Chief Complaint:  "My bag of water broke at 10:30 this morning..idiopathic thrombocytopenic purpura was clear fluid "  My contractions started after that. Denies  Vaginal bleeding. Good FM HPI:  Laurie Stewart is a 29 y.o. G87P1001 female with EDC= 12/25/2014 at [redacted]w[redacted]d dated by LMP +18 week ultrasound.  Her pregnancy has been complicated by late onset of care, insufficient care, obesity (BMI=33 initially with a 29-39# weight gain this pregnancy), slightly thickened NF with a negative Infomaseq, and elevated blood pressures in office yesterday with normal labs..  She presents to L&D for evaluation of SROM.   Prenatal care site: Prenatal care at [facility] has been remarkable for onset of care at 15 weeks, few prenatal appts (only 2 in the last trimester), and MJ use (UDS was positive yesterday, but denies drug use)      Maternal Medical History:   Past Medical History  Diagnosis Date  . Obesity affecting pregnancy   . Eczema     Past Surgical History  Procedure Laterality Date  . No past surgeries      No Known Allergies  Prior to Admission medications   Medication Sig Start Date End Date Taking? Authorizing Provider  lansoprazole (PREVACID) 30 MG capsule Take 30 mg by mouth daily at 12 noon.   Yes Historical Provider, MD  Prenatal Vit-Fe Fumarate-FA (MULTIVITAMIN-PRENATAL) 27-0.8 MG TABS tablet Take 1 tablet by mouth daily at 12 noon.   Yes Historical Provider, MD          Social History: She  reports that she quit smoking about 7 months ago. She does not have any smokeless tobacco history on file. She reports that she does not drink alcohol or use illicit drugs. UDS positive for MJ 8/29  Family History: family history includes Arthritis in her father.   Review of Systems: Negative x 10 systems reviewed except as noted in the HPI.      Physical Exam:  Vital Signs: BP 124/81 mmHg  Pulse 73  Temp(Src) 97.9 F (36.6 C)  (Oral)  Resp 20  Ht 5' 8.5" (1.74 m)  Wt 238 lb (107.956 kg)  BMI 35.66 kg/m2 General: no acute distress, wincing with some contractions  HEENT: normocephalic, atraumatic Heart: regular rate & rhythm.  No murmurs Lungs: clear to auscultation bilaterally Abdomen: soft, gravid, non-tender;  EFW: 7 1/2 # Pelvic:   External: Normal external female genitalia  Cervix: 2/60%/-2 per RN exam  ROM: grossly ruptured membranes with clear fluid in her underwear and in her sandals Extremities: non-tender, symmetric, no edema bilaterally.  DTRs:+1 barely Neurologic: Alert & oriented x 3.    Pertinent Results:  Prenatal Labs: Blood type/Rh AB positive  Antibody screen negative  Rubella Varicella Immune immune  RPR nonreactive  HBsAg negative  HIV negative  GC negative  Chlamydia negative  Genetic screening Neg Informaseq  1 hour GTT 70  3 hour GTT NA  GBS unknown    Baseline FHR: 140 with accelerations to 150s, moderate variability.  Bedside Ultrasound:    Presentation: vertex/ROP    Assessment:  Laurie Stewart is a 29 y.o. G43P1001 female at 109w1d with SROM  Plan:  1. Admit to Labor & Delivery - notify attending    2. CBC, ABO/RH, CMP/ PC ratio, Clrs, IVF 3. GBS unknown- Ampicillin started 4. Stadol for pain.  Epidural if desires and when appropriate 5. Consents obtained. 6.   Laurie Stewart  12/19/2014 12:46 PM

## 2014-12-19 NOTE — Discharge Summary (Signed)
Obstetrical Discharge Summary  Date of Admission: 12/19/2014 Date of Discharge: 12/21/2014  Primary OB: [facility]  Gestational Age at Delivery: [redacted]w[redacted]d   Antepartum complications: gestational HTN, insufficient prenatal care, history of THC use during pregnancy, BMI 76 Reason for Admission: SROM Date of Delivery: 12/21/2014  Delivered By: Cornelia Copa MD Delivery Type: spontaneous vaginal delivery Intrapartum complications/course: None Anesthesia: epidural Placenta: expressed. Intact: yes. To pathology: yes.  Laceration: none Episiotomy: none Baby: Liveborn female, APGARs8/9, weight 3800 g.   Postpartum course: Routine Disposition: Home  Rh Immune globulin given: not applicable Rubella vaccine given: not applicable Tdap vaccine given in AP or PP setting: yes Flu vaccine given in AP or PP setting: not applicable  Contraception: Depo injection  Prenatal/Postnatal Panel: AB POS//Rubella Immune//Varicella Immune//RPR negative//HIV negative/HepB Surface Ag negative//pap neg 2016 (no EC/TZ seen)//plans to breastfeed  Plan:  Laurie Stewart was discharged to home in good condition. Follow-up appointment with Dr. Vergie Living in 4 weeks  Discharge Medications:   Medication List    TAKE these medications        ibuprofen 600 MG tablet  Commonly known as:  ADVIL,MOTRIN  Take 1 tablet (600 mg total) by mouth every 6 (six) hours.     lansoprazole 30 MG capsule  Commonly known as:  PREVACID  Take 30 mg by mouth daily at 12 noon.     multivitamin-prenatal 27-0.8 MG Tabs tablet  Take 1 tablet by mouth daily at 12 noon.       Annamarie Major, MD [facility]

## 2014-12-19 NOTE — Progress Notes (Addendum)
OB Note CTSP regarding difficulty tracing UCs due to patient discomfort and writhing in bed  Category I tracing with accels and appears to be q59m UCs Very uncomfortable with contractions despite IV meds  D/w regarding options and comfort level and desires epidural, which is fine. Pt likely transitioning into active labor. Pitocin at 1. Will continue at this rate until epidural is placed.   Cornelia Copa MD [facility]  Pager: [phone]

## 2014-12-20 LAB — RPR: RPR: NONREACTIVE

## 2014-12-20 NOTE — Progress Notes (Signed)
Subjective:  Doing well, no problems, minimal lochia  Objective:   Blood pressure 124/67, pulse 63, temperature 98.8 F (37.1 C), temperature source Oral, resp. rate 18, height 5' 8.5" (1.74 m), weight 107.956 kg (238 lb), SpO2 99 %, unknown if currently breastfeeding.  General: NAD Pulmonary: no increased work of breathing Abdomen: non-distended, non-tender, fundus firm at level of umbilicus Extremities: no edema, no erythema, no tenderness  Results for orders placed or performed during the hospital encounter of 12/19/14 (from the past 72 hour(s))  Type and screen     Status: None   Collection Time: 12/19/14 12:23 PM  Result Value Ref Range   ABO/RH(D) AB POS    Antibody Screen NEG    Sample Expiration 12/22/2014   CBC     Status: Abnormal   Collection Time: 12/19/14 12:23 PM  Result Value Ref Range   WBC 13.3 (H) 3.6 - 11.0 K/uL   RBC 4.17 3.80 - 5.20 MIL/uL   Hemoglobin 12.1 12.0 - 16.0 g/dL   HCT 37.6 35.0 - 47.0 %   MCV 90.3 80.0 - 100.0 fL   MCH 29.0 26.0 - 34.0 pg   MCHC 32.1 32.0 - 36.0 g/dL   RDW 14.5 11.5 - 14.5 %   Platelets 269 150 - 440 K/uL  Comprehensive metabolic panel     Status: Abnormal   Collection Time: 12/19/14 12:23 PM  Result Value Ref Range   Sodium 138 135 - 145 mmol/L   Potassium 4.2 3.5 - 5.1 mmol/L   Chloride 108 101 - 111 mmol/L   CO2 23 22 - 32 mmol/L   Glucose, Bld 95 65 - 99 mg/dL   BUN 5 (L) 6 - 20 mg/dL   Creatinine, Ser 0.54 0.44 - 1.00 mg/dL   Calcium 8.7 (L) 8.9 - 10.3 mg/dL   Total Protein 6.5 6.5 - 8.1 g/dL   Albumin 2.7 (L) 3.5 - 5.0 g/dL   AST 29 15 - 41 U/L   ALT 20 14 - 54 U/L   Alkaline Phosphatase 163 (H) 38 - 126 U/L   Total Bilirubin 0.3 0.3 - 1.2 mg/dL   GFR calc non Af Amer >60 >60 mL/min   GFR calc Af Amer >60 >60 mL/min    Comment: (NOTE) The eGFR has been calculated using the CKD EPI equation. This calculation has not been validated in all clinical situations. eGFR's persistently <60 mL/min signify possible  Chronic Kidney Disease.    Anion gap 7 5 - 15  RPR     Status: None   Collection Time: 12/19/14 12:23 PM  Result Value Ref Range   RPR Ser Ql Non Reactive Non Reactive    Comment: (NOTE) Performed At: [facility] [place], [place] [id number] Lindon Romp MD [id number]   ABO/Rh     Status: None   Collection Time: 12/19/14 12:24 PM  Result Value Ref Range   ABO/RH(D) AB POS   Protein / creatinine ratio, urine     Status: None   Collection Time: 12/19/14  5:05 PM  Result Value Ref Range   Creatinine, Urine 107 mg/dL   Total Protein, Urine 15 mg/dL    Comment: NO NORMAL RANGE ESTABLISHED FOR THIS TEST   Protein Creatinine Ratio 0.14 0.00 - 0.15 mg/mg[Cre]    Assessment:   29 y.o. G2P2002 postpartum day # 1 TSVD  Plan:  1) A pos / RI / VZI   2) Continue routine postpartum care  3) Breast/Depo Provera  5)  Disposition anticipated discharge PPD2

## 2014-12-20 NOTE — Addendum Note (Signed)
Addendum  created 12/20/14 0744 by Mathews Argyle, CRNA   Modules edited: Notes Section   Notes Section:  File: [id number]

## 2014-12-20 NOTE — Progress Notes (Signed)
After pt spoke with Pediatrician (Dr. Dierdre Highman) about the recommendations of not breastfeeding with positive Marijuana (UDS); Patient states to nurse "I still want to breastfeed"

## 2014-12-20 NOTE — Anesthesia Postprocedure Evaluation (Addendum)
  Anesthesia Post-op Note  Patient: Laurie Stewart  Procedure(s) Performed: epidural  Anesthesia type:Regional, Epidural  Patient location:337  Post pain: Pain level controlled  Post assessment: Post-op Vital signs reviewed, Patient's Cardiovascular Status Stable, Respiratory Function Stable, Patent Airway and No signs of Nausea or vomiting  Post vital signs: Reviewed and stable  Last Vitals:  Filed Vitals:   12/20/14 0330  BP: 127/76  Pulse: 83  Temp: 36.7 C  Resp: 18    Level of consciousness: awake, alert  and patient cooperative  Complications: No apparent anesthesia complications

## 2014-12-20 NOTE — Progress Notes (Signed)
After discussing with pediatrician (dr. Dierdre Highman) about the AAP recommendations to not breastfeed if positive for marijuana (+UDS), the patient reported via telephone to pediatrician that "i didn't know i was positive for marijuana at the hospital"; have OB review this information during rounds in a.m. please

## 2014-12-21 LAB — SURGICAL PATHOLOGY

## 2014-12-21 LAB — HEMOGLOBIN: HEMOGLOBIN: 11.4 g/dL — AB (ref 12.0–16.0)

## 2014-12-21 MED ORDER — MEDROXYPROGESTERONE ACETATE 150 MG/ML IM SUSP
150.0000 mg | Freq: Once | INTRAMUSCULAR | Status: AC
  Administered 2014-12-21: 150 mg via INTRAMUSCULAR
  Filled 2014-12-21: qty 1

## 2014-12-21 MED ORDER — IBUPROFEN 600 MG PO TABS: 600.0000 mg | ORAL_TABLET | Freq: Four times a day (QID) | ORAL | Status: DC

## 2014-12-21 NOTE — Progress Notes (Signed)
Admit Date: 12/19/2014 Today's Date: 12/21/2014  Post Partum Day 2  Subjective:  no complaints, up ad lib, voiding and tolerating PO  Objective: Temp:  [98 F (36.7 C)-98.8 F (37.1 C)] 98.1 F (36.7 C) (09/01 0813) Pulse Rate:  [63-76] 66 (09/01 0813) Resp:  [16-18] 16 (09/01 0813) BP: (121-125)/(67-77) 125/77 mmHg (09/01 0813) SpO2:  [100 %] 100 % (09/01 0813)  Physical Exam:  General: alert and cooperative Lochia: appropriate Uterine Fundus: firm Incision: none DVT Evaluation: No evidence of DVT seen on physical exam.   Recent Labs  12/18/14 1318 12/19/14 1223  HGB 12.3 12.1  HCT 37.2 37.6    Assessment/Plan: Discharge home, Breastfeeding, Contraception - Depo given here; and Infant doing well  Hgb pending.  Consider iron with PNV   LOS: 2 days   Laurie Stewart [facility] 12/21/2014, 9:16 AM

## 2014-12-21 NOTE — Discharge Instructions (Signed)
° °Vaginal Delivery, Care After °Refer to this sheet in the next few weeks. These discharge instructions provide you with information on caring for yourself after delivery. Your caregiver may also give you specific instructions. Your treatment has been planned according to the most current medical practices available, but problems sometimes occur. Call your caregiver if you have any problems or questions after you go home. °HOME CARE INSTRUCTIONS °1. Take over-the-counter or prescription medicines only as directed by your caregiver or pharmacist. °2. Do not drink alcohol, especially if you are breastfeeding or taking medicine to relieve pain. °3. Do not smoke tobacco. °4. Continue to use good perineal care. Good perineal care includes: °1. Wiping your perineum from back to front °2. Keeping your perineum clean. °3. You can do sitz baths twice a day, to help keep this area clean °5. Do not use tampons, douche or have sex until your caregiver says it is okay. °6. Shower only and avoid sitting in submerged water, aside from sitz baths °7. Wear a well-fitting bra that provides breast support. °8. Eat healthy foods. °9. Drink enough fluids to keep your urine clear or pale yellow. °10. Eat high-fiber foods such as whole grain cereals and breads, brown rice, beans, and fresh fruits and vegetables every day. These foods may help prevent or relieve constipation. °11. Avoid constipation with high fiber foods or medications, such as miralax or metamucil °12. Follow your caregiver's recommendations regarding resumption of activities such as climbing stairs, driving, lifting, exercising, or traveling. °13. Talk to your caregiver about resuming sexual activities. Resumption of sexual activities is dependent upon your risk of infection, your rate of healing, and your comfort and desire to resume sexual activity. °14. Try to have someone help you with your household activities and your newborn for at least a few days after you  leave the hospital. °15. Rest as much as possible. Try to rest or take a nap when your newborn is sleeping. °16. Increase your activities gradually. °17. Keep all of your scheduled postpartum appointments. It is very important to keep your scheduled follow-up appointments. At these appointments, your caregiver will be checking to make sure that you are healing physically and emotionally. °SEEK MEDICAL CARE IF:  °· You are passing large clots from your vagina. Save any clots to show your caregiver. °· You have a foul smelling discharge from your vagina. °· You have trouble urinating. °· You are urinating frequently. °· You have pain when you urinate. °· You have a change in your bowel movements. °· You have increasing redness, pain, or swelling near your vaginal incision (episiotomy) or vaginal tear. °· You have pus draining from your episiotomy or vaginal tear. °· Your episiotomy or vaginal tear is separating. °· You have painful, hard, or reddened breasts. °· You have a severe headache. °· You have blurred vision or see spots. °· You feel sad or depressed. °· You have thoughts of hurting yourself or your newborn. °· You have questions about your care, the care of your newborn, or medicines. °· You are dizzy or light-headed. °· You have a rash. °· You have nausea or vomiting. °· You were breastfeeding and have not had a menstrual period within 12 weeks after you stopped breastfeeding. °· You are not breastfeeding and have not had a menstrual period by the 12th week after delivery. °· You have a fever. °SEEK IMMEDIATE MEDICAL CARE IF:  °· You have persistent pain. °· You have chest pain. °· You have shortness of breath. °·   You faint.  You have leg pain.  You have stomach pain.  Your vaginal bleeding saturates two or more sanitary pads in 1 hour. MAKE SURE YOU:   Understand these instructions.  Will watch your condition.  Will get help right away if you are not doing well or get worse. Document Released:  04/04/2000 Document Revised: 08/22/2013 Document Reviewed: 12/03/2011 [facility] Patient Information 2015 [place], [place]. This information is not intended to replace advice given to you by your health care provider. Make sure you discuss any questions you have with your health care provider.  Sitz Bath A sitz bath is a warm water bath taken in the sitting position. The water covers only the hips and butt (buttocks). We recommend using one that fits in the toilet, to help with ease of use and cleanliness. It may be used for either healing or cleaning purposes. Sitz baths are also used to relieve pain, itching, or muscle tightening (spasms). The water may contain medicine. Moist heat will help you heal and relax.  HOME CARE  Take 3 to 4 sitz baths a day. 18. Fill the bathtub half-full with warm water. 19. Sit in the water and open the drain a little. 20. Turn on the warm water to keep the tub half-full. Keep the water running constantly. 21. Soak in the water for 15 to 20 minutes. 22. After the sitz bath, pat the affected area dry. GET HELP RIGHT AWAY IF: You get worse instead of better. Stop the sitz baths if you get worse. MAKE SURE YOU:  Understand these instructions.  Will watch your condition.  Will get help right away if you are not doing well or get worse. Document Released: 05/15/2004 Document Revised: 12/31/2011 Document Reviewed: 08/05/2010 [facility] Patient Information 2015 [place], [place]. This information is not intended to replace advice given to you by your health care provider. Make sure you discuss any questions you have with your health care provider.     We will discuss yourdelivery once again in detail at your post-partum visit in four to six weeks. If you havent already done so, please call to make your appointment as soon as possible.  [facility] [place]  [address]  [place], [place] [address] [place], [place] [address]  Phone: [phone]  Phone: [phone]  Fax: [phone] Fax: [phone]

## 2014-12-21 NOTE — Clinical Social Work Maternal (Addendum)
  CLINICAL SOCIAL WORK MATERNAL/CHILD NOTE  Patient Details  Name: Laurie Stewart MRN: [id number] Date of Birth: Sep 17, 1985  Date:  12/21/2014  Clinical Social Worker Initiating Note:  Wilford Grist, LCSW Date/ Time Initiated:  12/20/14/1700     Child's Name:  unknown yet   Legal Guardian:    Parents  Need for Interpreter:  None   Date of Referral:  12/20/14     Reason for Referral:  Current Substance Use/Substance Use During Pregnancy    Referral Source:  RN   Address:  [place]  Phone number:      Household Members:  Significant Other Alden Server "Charlie" )   Natural Supports (not living in the home):  Extended Family, Friends   Herbalist: None   Employment: Environmental education officer   Type of Work:  (production)   Education:  Associate Professor Resources:  Medicaid   Other Resources:  Sales executive , [employer]   Cultural/Religious Considerations Which May Impact Care:   Strengths:  Ability to meet basic needs , Understanding of illness, Home prepared for child    Risk Factors/Current Problems:  None   Cognitive State:  Alert    Mood/Affect:  Happy , Interested , Comfortable    CSW Assessment: CSW was referred to Pt due to +UDS in Pt and baby. CSW attempted to meet with mother twice before and she ask for CSW to return after she was showered and feeling better. Upon CSW return, Pt reported feeling "much better" and was more engaging with CSW. MOB was holding baby throughout assessment. CSW reviewed Pt's resources and home supports. 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 Pt is on FMLA while recovering from having the baby. Pt lives with her partner Laurie Stewart, who has become a dad for the first time with this birth.  Pt shares that she has a 59 year old son who lives with her God Mother in [place]. He has been there since he was 10 due to have difficulties in local schools that was leading to him being sent to a school for "behaviors"  so the God Mother suggested trying a larger school district in [place]. Pt reports that her son has done well in school now and she speaks and sees him regularly. CSW discussed +THC in Pt and baby's urine. Pt denies regular use and shares that she is only aware of eating a brownie with THC before she knew she was pregnant around her 12wk mark.  CSW informed Pt of mandatory reporting policy when baby has +UDS as well. CSW will contact [place] DSS for report.  Pt has no CSW needs. She is prepared and excited about bringing home her son. Pt likely to dc on Thursday.   CSW Plan/Description:  Child Protective Service Report , No Further Intervention Required/No Barriers to Discharge    Ned Card, LCSW 12/21/2014, 10:43 AM  Copy of Assessment in baby's chart.

## 2014-12-21 NOTE — Progress Notes (Signed)
Patient discharge to home via wheelchair with spouse and baby.  Discharge instructions given to patient.

## 2015-04-14 ENCOUNTER — Encounter: Payer: Self-pay | Admitting: Emergency Medicine

## 2015-04-14 ENCOUNTER — Emergency Department: Payer: No Typology Code available for payment source

## 2015-04-14 ENCOUNTER — Emergency Department
Admission: EM | Admit: 2015-04-14 | Discharge: 2015-04-15 | Disposition: A | Discharge status: Home / Self Care | Payer: No Typology Code available for payment source | Attending: Emergency Medicine | Admitting: Emergency Medicine

## 2015-04-14 DIAGNOSIS — S39012A Strain of muscle, fascia and tendon of lower back, initial encounter: Secondary | ICD-10-CM | POA: Diagnosis not present

## 2015-04-14 DIAGNOSIS — Y9389 Activity, other specified: Secondary | ICD-10-CM | POA: Diagnosis not present

## 2015-04-14 DIAGNOSIS — Z79899 Other long term (current) drug therapy: Secondary | ICD-10-CM | POA: Diagnosis not present

## 2015-04-14 DIAGNOSIS — Z87891 Personal history of nicotine dependence: Secondary | ICD-10-CM | POA: Diagnosis not present

## 2015-04-14 DIAGNOSIS — S29012A Strain of muscle and tendon of back wall of thorax, initial encounter: Secondary | ICD-10-CM | POA: Insufficient documentation

## 2015-04-14 DIAGNOSIS — S199XXA Unspecified injury of neck, initial encounter: Secondary | ICD-10-CM | POA: Diagnosis present

## 2015-04-14 DIAGNOSIS — Y92410 Unspecified street and highway as the place of occurrence of the external cause: Secondary | ICD-10-CM | POA: Insufficient documentation

## 2015-04-14 DIAGNOSIS — Z3202 Encounter for pregnancy test, result negative: Secondary | ICD-10-CM | POA: Insufficient documentation

## 2015-04-14 DIAGNOSIS — Y998 Other external cause status: Secondary | ICD-10-CM | POA: Diagnosis not present

## 2015-04-14 DIAGNOSIS — S161XXA Strain of muscle, fascia and tendon at neck level, initial encounter: Secondary | ICD-10-CM | POA: Diagnosis not present

## 2015-04-14 DIAGNOSIS — S29019A Strain of muscle and tendon of unspecified wall of thorax, initial encounter: Secondary | ICD-10-CM

## 2015-04-14 LAB — POCT PREGNANCY, URINE: Preg Test, Ur: NEGATIVE

## 2015-04-14 NOTE — ED Notes (Signed)
Pt to radiology.
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
## 2015-04-15 MED ORDER — HYDROCODONE-ACETAMINOPHEN 5-325 MG PO TABS: 1.0000 | ORAL_TABLET | ORAL | Status: DC | PRN
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
## 2015-04-15 NOTE — ED Notes (Signed)
Pt verbalized wanting to leave and did not need to wait for xrays. At time of d/c pt reported she would sign but then wanted to head home. Pt ambulated independently and without difficulty. Verbalized understanding of d/c instructions.

## 2015-07-29 ENCOUNTER — Emergency Department
Admission: EM | Admit: 2015-07-29 | Discharge: 2015-07-29 | Disposition: A | Discharge status: Home / Self Care | Payer: No Typology Code available for payment source | Attending: Emergency Medicine | Admitting: Emergency Medicine

## 2015-07-29 DIAGNOSIS — Z87891 Personal history of nicotine dependence: Secondary | ICD-10-CM | POA: Insufficient documentation

## 2015-07-29 DIAGNOSIS — Z79899 Other long term (current) drug therapy: Secondary | ICD-10-CM | POA: Insufficient documentation

## 2015-07-29 DIAGNOSIS — Z791 Long term (current) use of non-steroidal anti-inflammatories (NSAID): Secondary | ICD-10-CM | POA: Insufficient documentation

## 2015-07-29 DIAGNOSIS — B349 Viral infection, unspecified: Secondary | ICD-10-CM | POA: Insufficient documentation

## 2015-07-29 LAB — POCT RAPID STREP A: Streptococcus, Group A Screen (Direct): NEGATIVE

## 2015-07-29 MED ORDER — LIDOCAINE VISCOUS 2 % MT SOLN
15.0000 mL | Freq: Once | OROMUCOSAL | Status: AC
  Administered 2015-07-29: 15 mL via OROMUCOSAL

## 2015-07-29 MED ORDER — PREDNISONE 10 MG (21) PO TBPK: ORAL_TABLET | ORAL | Status: DC

## 2015-07-29 MED ORDER — LIDOCAINE VISCOUS 2 % MT SOLN
OROMUCOSAL | Status: AC
  Administered 2015-07-29: 15 mL via OROMUCOSAL
  Filled 2015-07-29: qty 15

## 2015-07-29 MED ORDER — HYDROCOD POLST-CPM POLST ER 10-8 MG/5ML PO SUER: 5.0000 mL | Freq: Two times a day (BID) | ORAL | Status: DC

## 2015-07-29 NOTE — ED Notes (Signed)
Pt reports sore throat since the 5th, pt states that the rt side of her throat and ear are hurting, reports cough, congestion, fever on the 6th unable to work that night

## 2015-07-29 NOTE — Discharge Instructions (Signed)
Viral Infections °A viral infection can be caused by different types of viruses. Most viral infections are not serious and resolve on their own. However, some infections may cause severe symptoms and may lead to further complications. °SYMPTOMS °Viruses can frequently cause: °· Minor sore throat. °· Aches and pains. °· Headaches. °· Runny nose. °· Different types of rashes. °· Watery eyes. °· Tiredness. °· Cough. °· Loss of appetite. °· Gastrointestinal infections, resulting in nausea, vomiting, and diarrhea. °These symptoms do not respond to antibiotics because the infection is not caused by bacteria. However, you might catch a bacterial infection following the viral infection. This is sometimes called a "superinfection." Symptoms of such a bacterial infection may include: °· Worsening sore throat with pus and difficulty swallowing. °· Swollen neck glands. °· Chills and a high or persistent fever. °· Severe headache. °· Tenderness over the sinuses. °· Persistent overall ill feeling (malaise), muscle aches, and tiredness (fatigue). °· Persistent cough. °· Yellow, green, or brown mucus production with coughing. °HOME CARE INSTRUCTIONS  °· Only take over-the-counter or prescription medicines for pain, discomfort, diarrhea, or fever as directed by your caregiver. °· Drink enough water and fluids to keep your urine clear or pale yellow. Sports drinks can provide valuable electrolytes, sugars, and hydration. °· Get plenty of rest and maintain proper nutrition. Soups and broths with crackers or rice are fine. °SEEK IMMEDIATE MEDICAL CARE IF:  °· You have severe headaches, shortness of breath, chest pain, neck pain, or an unusual rash. °· You have uncontrolled vomiting, diarrhea, or you are unable to keep down fluids. °· You or your child has an oral temperature above 102° F (38.9° C), not controlled by medicine. °· Your baby is older than 3 months with a rectal temperature of 102° F (38.9° C) or higher. °· Your baby is 3  months old or younger with a rectal temperature of 100.4° F (38° C) or higher. °MAKE SURE YOU:  °· Understand these instructions. °· Will watch your condition. °· Will get help right away if you are not doing well or get worse. °  °This information is not intended to replace advice given to you by your health care provider. Make sure you discuss any questions you have with your health care provider. °  °Document Released: 01/15/2005 Document Revised: 06/30/2011 Document Reviewed: 09/13/2014 °Elsevier Interactive Patient Education ©2016 Elsevier Inc. ° °

## 2015-07-29 NOTE — ED Provider Notes (Signed)
[facility] Emergency Department Provider Note     Time seen: ----------------------------------------- 9:22 PM on 07/29/2015 -----------------------------------------    I have reviewed the triage vital signs and the nursing notes.   HISTORY  Chief Complaint Sore Throat    HPI Laurie L Avie EchevariaGoudie is a 30 y.o. female who presents to ER for sore throat since April 5. Patient states the right side of her throat and ear hurting. She is also had cough congestion and fever and felt like she was unable to work last night. She denies any other complaints.   Past Medical History  Diagnosis Date  . Obesity affecting pregnancy   . Eczema     Patient Active Problem List   Diagnosis Date Noted  . Labor and delivery, indication for care 12/19/2014  . Hypertension affecting pregnancy 12/18/2014    Past Surgical History  Procedure Laterality Date  . No past surgeries      Allergies Review of patient's allergies indicates no known allergies.  Social History Social History  Substance Use Topics  . Smoking status: Former Smoker    Quit date: 05/19/2014  . Smokeless tobacco: Not on file  . Alcohol Use: No    Review of Systems Constitutional: Positive for recent fever Eyes: Negative for visual changes. ENT: Positive for sore throat, earache Cardiovascular: Negative for chest pain. Respiratory: Positive for cough and congestion Gastrointestinal: Negative for abdominal pain, vomiting and diarrhea.  ____________________________________________   PHYSICAL EXAM:  VITAL SIGNS: ED Triage Vitals  Enc Vitals Group     BP 07/29/15 2056 123/82 mmHg     Pulse Rate 07/29/15 2056 69     Resp 07/29/15 2056 18     Temp 07/29/15 2056 98.8 F (37.1 C)     Temp Source 07/29/15 2056 Oral     SpO2 07/29/15 2056 97 %     Weight 07/29/15 2056 230 lb (104.327 kg)     Height 07/29/15 2056 5\' 8"  (1.727 m)     Head Cir --      Peak Flow --      Pain Score 07/29/15  2059 10     Pain Loc --      Pain Edu? --      Excl. in GC? --     Constitutional: Alert and oriented. Well appearing and in no distress. Eyes: Conjunctivae are normal. PERRL. Normal extraocular movements. ENT   Head: Normocephalic and atraumatic.   Nose: Mild congestion   Mouth/Throat: Mucous membranes are moist. Tonsillar hypertrophy with minimal erythema   Neck: No stridor. No significant adenopathy Cardiovascular: Normal rate, regular rhythm. No murmurs, rubs, or gallops. Respiratory: Normal respiratory effort without tachypnea nor retractions. Breath sounds are clear and equal bilaterally. No wheezes/rales/rhonchi. ___________________________________________  ED COURSE:  Pertinent labs & imaging results that were available during my care of the patient were reviewed by me and considered in my medical decision making (see chart for details). Patient clinically with viral syndrome, we will obtain a point of care strep testing. ____________________________________________    LABS (pertinent positives/negatives)  Labs Reviewed  CULTURE, GROUP A STREP [facility]  POCT RAPID STREP A   ____________________________________________  FINAL ASSESSMENT AND PLAN  Viral syndrome  Plan: Patient with labs as dictated above. Patient clinically with a viral illness, I have advised if bacteria grow out of her throat culture we will call her and antibiotics. She'll be discharged with Tussionex and prednisone. She is stable for discharge.   Emily FilbertWilliams, Ediberto Sens E, MD  Emily Filbert, MD 07/29/15 2124

## 2015-08-01 LAB — CULTURE, GROUP A STREP (THRC)

## 2015-08-02 NOTE — Progress Notes (Signed)
Patient was seen in ED on 07-29-15 with complaints of sore throat/ear. Patient was diagnosed with viral pharyngitis and told we would contact her if cultures came back positive.   Throat culture resulted today growing group B strep. Called phone number that patient has on file to discuss results and get pharmacy information to call in antibiotics. Was told that the patient did not live at that number and I had the wrong number.  Cindi CarbonMary M Kiah Vanalstine, PharmD 08/02/2015 1:57 PM

## 2015-09-07 ENCOUNTER — Emergency Department
Admission: EM | Admit: 2015-09-07 | Discharge: 2015-09-07 | Disposition: A | Discharge status: Home / Self Care | Payer: Self-pay | Attending: Emergency Medicine | Admitting: Emergency Medicine

## 2015-09-07 DIAGNOSIS — B369 Superficial mycosis, unspecified: Secondary | ICD-10-CM

## 2015-09-07 DIAGNOSIS — F17200 Nicotine dependence, unspecified, uncomplicated: Secondary | ICD-10-CM | POA: Insufficient documentation

## 2015-09-07 DIAGNOSIS — Z79899 Other long term (current) drug therapy: Secondary | ICD-10-CM | POA: Insufficient documentation

## 2015-09-07 DIAGNOSIS — R21 Rash and other nonspecific skin eruption: Secondary | ICD-10-CM | POA: Insufficient documentation

## 2015-09-07 MED ORDER — PREDNISONE 20 MG PO TABS
60.0000 mg | ORAL_TABLET | Freq: Once | ORAL | Status: AC
  Administered 2015-09-07: 60 mg via ORAL

## 2015-09-07 MED ORDER — PREDNISONE 20 MG PO TABS: 60.0000 mg | ORAL_TABLET | Freq: Every day | ORAL | Status: AC

## 2015-09-07 MED ORDER — DIPHENHYDRAMINE HCL 25 MG PO CAPS
ORAL_CAPSULE | ORAL | Status: AC
  Administered 2015-09-07: 50 mg via ORAL
  Filled 2015-09-07: qty 2

## 2015-09-07 MED ORDER — PREDNISONE 20 MG PO TABS
ORAL_TABLET | ORAL | Status: AC
  Administered 2015-09-07: 60 mg via ORAL
  Filled 2015-09-07: qty 3

## 2015-09-07 MED ORDER — DIPHENHYDRAMINE HCL 25 MG PO CAPS
50.0000 mg | ORAL_CAPSULE | Freq: Once | ORAL | Status: AC
  Administered 2015-09-07: 50 mg via ORAL

## 2015-09-07 MED ORDER — NYSTATIN 100000 UNIT/GM EX CREA
1.0000 "application " | TOPICAL_CREAM | Freq: Three times a day (TID) | CUTANEOUS | Status: DC
  Filled 2015-09-07: qty 15

## 2015-09-07 MED ORDER — NYSTATIN 100000 UNIT/GM EX CREA: 1.0000 "application " | TOPICAL_CREAM | Freq: Three times a day (TID) | CUTANEOUS | Status: AC

## 2015-09-07 NOTE — ED Provider Notes (Signed)
[facility] Emergency Department Provider Note  ____________________________________________  Time seen: 5:15 AM  I have reviewed the triage vital signs and the nursing notes.   HISTORY  Chief Complaint Rash      HPI Laurie Stewart is a 30 y.o. female with history of eczema presents with raised red pruritic rash bilateral elbows and knees as well as proximal thighs. Patient denies any fever. Patient states she apply triamcinolone cream to the area but appears to have worsened rash by doing so. Patient denies any change in detergent or soap.     Past Medical History  Diagnosis Date  . Obesity affecting pregnancy   . Eczema     Patient Active Problem List   Diagnosis Date Noted  . Labor and delivery, indication for care 12/19/2014  . Hypertension affecting pregnancy 12/18/2014    Past Surgical History  Procedure Laterality Date  . No past surgeries      Current Outpatient Rx  Name  Route  Sig  Dispense  Refill  . chlorpheniramine-HYDROcodone (TUSSIONEX PENNKINETIC ER) 10-8 MG/5ML SUER   Oral   Take 5 mLs by mouth 2 (two) times daily.   140 mL   0   . HYDROcodone-acetaminophen (NORCO/VICODIN) 5-325 MG tablet   Oral   Take 1 tablet by mouth every 4 (four) hours as needed for moderate pain.   15 tablet   0   . ibuprofen (ADVIL,MOTRIN) 600 MG tablet   Oral   Take 1 tablet (600 mg total) by mouth every 6 (six) hours.   60 tablet   2   . lansoprazole (PREVACID) 30 MG capsule   Oral   Take 30 mg by mouth daily at 12 noon.         . predniSONE (STERAPRED UNI-PAK 21 TAB) 10 MG (21) TBPK tablet      Dispense steroid taper pack as directed   21 tablet   0   . Prenatal Vit-Fe Fumarate-FA (MULTIVITAMIN-PRENATAL) 27-0.8 MG TABS tablet   Oral   Take 1 tablet by mouth daily at 12 noon.           Allergies No known drug allergies  Family History  Problem Relation Age of Onset  . Arthritis Father     Social History Social  History  Substance Use Topics  . Smoking status: Current Every Day Smoker    Last Attempt to Quit: 05/19/2014  . Smokeless tobacco: None  . Alcohol Use: No    Review of Systems  Constitutional: Negative for fever. Eyes: Negative for visual changes. ENT: Negative for sore throat. Cardiovascular: Negative for chest pain. Respiratory: Negative for shortness of breath. Gastrointestinal: Negative for abdominal pain, vomiting and diarrhea. Genitourinary: Negative for dysuria. Musculoskeletal: Negative for back pain. Skin: Positive for rash. Neurological: Negative for headaches, focal weakness or numbness.   10-point ROS otherwise negative.  ____________________________________________   PHYSICAL EXAM:  VITAL SIGNS: ED Triage Vitals  Enc Vitals Group     BP 09/07/15 0422 124/84 mmHg     Pulse Rate 09/07/15 0422 80     Resp 09/07/15 0422 20     Temp 09/07/15 0422 98.1 F (36.7 C)     Temp Source 09/07/15 0422 Oral     SpO2 09/07/15 0422 98 %     Weight 09/07/15 0422 228 lb (103.42 kg)     Height 09/07/15 0422 5\' 8"  (1.727 m)     Head Cir --      Peak Flow --  Pain Score 09/07/15 0423 5     Pain Loc --      Pain Edu? --      Excl. in GC? --     Constitutional: Alert and oriented. Well appearing and in no distress. Eyes: Conjunctivae are normal. PERRL. Normal extraocular movements. ENT   Head: Normocephalic and atraumatic.   Nose: No congestion/rhinnorhea.   Mouth/Throat: Mucous membranes are moist.   Neck: No stridor. Hematological/Lymphatic/Immunilogical: No cervical lymphadenopathy. Cardiovascular: Normal rate, regular rhythm. Normal and symmetric distal pulses are present in all extremities. No murmurs, rubs, or gallops. Respiratory: Normal respiratory effort without tachypnea nor retractions. Breath sounds are clear and equal bilaterally. No wheezes/rales/rhonchi. Gastrointestinal: Soft and nontender. No distention. There is no CVA  tenderness. Genitourinary: deferred Musculoskeletal: Nontender with normal range of motion in all extremities. No joint effusions.  No lower extremity tenderness nor edema. Neurologic:  Normal speech and language. No gross focal neurologic deficits are appreciated. Speech is normal.  Skin:  Multiple ovoid lesions with right old with raised borders positive erythema noted on the extensor surfaces of the elbows distal arm swelling the patient's proximal thighs and knees Psychiatric: Mood and affect are normal. Speech and behavior are normal. Patient exhibits appropriate insight and judgment.  ____________________________________________      INITIAL IMPRESSION / ASSESSMENT AND PLAN / ED COURSE  Pertinent labs & imaging results that were available during my care of the patient were reviewed by me and considered in my medical decision making (see chart for details).  Rash consistent with possible fungal etiology potentially ringworm which is exacerbated with triamcinolone cream. Nystatin cream applied will be prescribed at home as well.  ____________________________________________   FINAL CLINICAL IMPRESSION(S) / ED DIAGNOSES  Final diagnoses:  Fungal rash of torso  Fungal rash on extremities    Darci Current, MD 09/07/15 [phone]

## 2015-09-07 NOTE — ED Notes (Signed)
Patient presents to ED for red, raised, itchy rash. Has eczema but states this is different. Actually used her Triamcinolone cream on it but it did not help. Has not tried OTC's to see if that helps alleviate symptoms. Rash is located on bilateral upper extremities, lower extremities, lower back, and inner thighs. Does not effect, face, neck, truck or upper back.

## 2015-09-07 NOTE — ED Notes (Signed)
Patient reports that she is feeling better. Itching has improved, and rash has slightly lightened in appearance. MD made aware.

## 2015-12-29 DIAGNOSIS — R0981 Nasal congestion: Secondary | ICD-10-CM | POA: Insufficient documentation

## 2015-12-29 DIAGNOSIS — Z5321 Procedure and treatment not carried out due to patient leaving prior to being seen by health care provider: Secondary | ICD-10-CM | POA: Insufficient documentation

## 2015-12-29 DIAGNOSIS — F17200 Nicotine dependence, unspecified, uncomplicated: Secondary | ICD-10-CM | POA: Insufficient documentation

## 2015-12-29 DIAGNOSIS — Z79899 Other long term (current) drug therapy: Secondary | ICD-10-CM | POA: Insufficient documentation

## 2015-12-30 ENCOUNTER — Emergency Department
Admission: EM | Admit: 2015-12-30 | Discharge: 2015-12-30 | Disposition: A | Discharge status: Home / Self Care | Payer: Self-pay | Attending: Emergency Medicine | Admitting: Emergency Medicine

## 2015-12-30 ENCOUNTER — Encounter: Payer: Self-pay | Admitting: Emergency Medicine

## 2015-12-30 NOTE — ED Triage Notes (Signed)
Patient with complaint of nasal congestion causing migraines times three days. Patient reports taking Excedrin with no relief.

## 2016-09-23 ENCOUNTER — Emergency Department: Payer: Self-pay

## 2016-09-23 ENCOUNTER — Encounter: Payer: Self-pay | Admitting: Emergency Medicine

## 2016-09-23 DIAGNOSIS — L309 Dermatitis, unspecified: Secondary | ICD-10-CM | POA: Insufficient documentation

## 2016-09-23 DIAGNOSIS — J209 Acute bronchitis, unspecified: Secondary | ICD-10-CM | POA: Insufficient documentation

## 2016-09-23 DIAGNOSIS — Z79899 Other long term (current) drug therapy: Secondary | ICD-10-CM | POA: Insufficient documentation

## 2016-09-23 DIAGNOSIS — Z791 Long term (current) use of non-steroidal anti-inflammatories (NSAID): Secondary | ICD-10-CM | POA: Insufficient documentation

## 2016-09-23 NOTE — ED Triage Notes (Signed)
Pt in with co persistent cough since Sunday, now co rib pain that is worse when she takes a deep breath. States feels shob at times, has tried otc meds without relief.

## 2016-09-24 ENCOUNTER — Emergency Department
Admission: EM | Admit: 2016-09-24 | Discharge: 2016-09-24 | Disposition: A | Discharge status: Home / Self Care | Payer: Self-pay | Attending: Emergency Medicine | Admitting: Emergency Medicine

## 2016-09-24 DIAGNOSIS — L309 Dermatitis, unspecified: Secondary | ICD-10-CM

## 2016-09-24 DIAGNOSIS — J209 Acute bronchitis, unspecified: Secondary | ICD-10-CM

## 2016-09-24 MED ORDER — HYDROCOD POLST-CPM POLST ER 10-8 MG/5ML PO SUER
5.0000 mL | Freq: Once | ORAL | Status: AC
  Administered 2016-09-24: 5 mL via ORAL
  Filled 2016-09-24: qty 5

## 2016-09-24 MED ORDER — ALBUTEROL SULFATE HFA 108 (90 BASE) MCG/ACT IN AERS: 2.0000 | INHALATION_SPRAY | RESPIRATORY_TRACT | 0 refills | Status: DC | PRN

## 2016-09-24 MED ORDER — PREDNISONE 20 MG PO TABS: ORAL_TABLET | ORAL | 0 refills | Status: DC

## 2016-09-24 MED ORDER — ALBUTEROL SULFATE (2.5 MG/3ML) 0.083% IN NEBU
2.5000 mg | INHALATION_SOLUTION | Freq: Once | RESPIRATORY_TRACT | Status: AC
  Administered 2016-09-24: 2.5 mg via RESPIRATORY_TRACT
  Filled 2016-09-24: qty 3

## 2016-09-24 MED ORDER — TRIAMCINOLONE 0.1 % CREAM:EUCERIN CREAM 1:1: 1.0000 "application " | TOPICAL_CREAM | Freq: Three times a day (TID) | CUTANEOUS | 0 refills | Status: DC | PRN

## 2016-09-24 MED ORDER — HYDROCOD POLST-CPM POLST ER 10-8 MG/5ML PO SUER: 5.0000 mL | Freq: Two times a day (BID) | ORAL | 0 refills | Status: DC

## 2016-09-24 MED ORDER — PREDNISONE 20 MG PO TABS
60.0000 mg | ORAL_TABLET | Freq: Once | ORAL | Status: AC
  Administered 2016-09-24: 60 mg via ORAL
  Filled 2016-09-24: qty 3

## 2016-09-24 NOTE — Discharge Instructions (Signed)
1. Finish steroid as prescribed (prednisone 60 mg daily 4 days; start your next dose Thursday morning). 2. You may take cough medicine as needed (Tussionex). 3. Use albuterol inhaler 2 puffs every 4 hours as needed for wheezing or coughing spasms. 4. Apply triamcinolone cream 3 times daily as needed to your palms and soles. 5. Return to the ER for worsening symptoms, persistent vomiting, difficult breathing or other concerns.

## 2016-09-24 NOTE — ED Notes (Signed)
Pt discharged to home.  Family member driving.  Discharge instructions reviewed.  Verbalized understanding.  No questions or concerns at this time.  Teach back verified.  Pt in NAD.  No items left in ED.   

## 2016-09-24 NOTE — ED Provider Notes (Signed)
[facility] Emergency Department Provider Note   ____________________________________________   First MD Initiated Contact with Patient 09/24/16 [phone]     (approximate)  I have reviewed the triage vital signs and the nursing notes.   HISTORY  Chief Complaint Cough    HPI Laurie Stewart is a 31 y.o. female who presents to the ED from home with a chief complaint of cough. Patient reports nonproductive cough 4 days; son with similar symptoms. Symptoms associated with subjective fever, rib pain, occasional wheezing and shortness of breath. Patient has tried over-the-counter medications without relief of her symptoms. Patient also presents for exacerbation of eczema on her hands and feet. Reports good improvement with triamcinolone cream, but lost her Medicaid and has not been able to obtain medications. Denies chest pain, abdominal pain, nausea, vomiting, diarrhea. Denies recent travel or trauma.   Past Medical History:  Diagnosis Date  . Eczema   . Obesity affecting pregnancy     Patient Active Problem List   Diagnosis Date Noted  . Labor and delivery, indication for care 12/19/2014  . Hypertension affecting pregnancy 12/18/2014    Past Surgical History:  Procedure Laterality Date  . NO PAST SURGERIES      Prior to Admission medications   Medication Sig Start Date End Date Taking? Authorizing Provider  albuterol (PROVENTIL HFA;VENTOLIN HFA) 108 (90 Base) MCG/ACT inhaler Inhale 2 puffs into the lungs every 4 (four) hours as needed for wheezing or shortness of breath. 09/24/16   Irean Hong, MD  chlorpheniramine-HYDROcodone (TUSSIONEX PENNKINETIC ER) 10-8 MG/5ML SUER Take 5 mLs by mouth 2 (two) times daily. 09/24/16   Irean Hong, MD  HYDROcodone-acetaminophen (NORCO/VICODIN) 5-325 MG tablet Take 1 tablet by mouth every 4 (four) hours as needed for moderate pain. 04/15/15   Tommi Rumps, PA-C  ibuprofen (ADVIL,MOTRIN) 600 MG tablet Take 1 tablet  (600 mg total) by mouth every 6 (six) hours. 12/21/14   Nadara Mustard, MD  lansoprazole (PREVACID) 30 MG capsule Take 30 mg by mouth daily at 12 noon.    [provider]  predniSONE (DELTASONE) 20 MG tablet 3 tablets daily x 4 days 09/24/16   Irean Hong, MD  Prenatal Vit-Fe Fumarate-FA (MULTIVITAMIN-PRENATAL) 27-0.8 MG TABS tablet Take 1 tablet by mouth daily at 12 noon.    [provider]  Triamcinolone Acetonide (TRIAMCINOLONE 0.1 % CREAM : EUCERIN) CREA Apply 1 application topically 3 (three) times daily as needed. 09/24/16   Irean Hong, MD    Allergies Patient has no known allergies.  Family History  Problem Relation Age of Onset  . Arthritis Father     Social History Social History  Substance Use Topics  . Smoking status: Current Every Day Smoker    Last attempt to quit: 05/19/2014  . Smokeless tobacco: Never Used  . Alcohol use No    Review of Systems  Constitutional: Positive for subjective fever/chills. Eyes: No visual changes. ENT: No sore throat. Cardiovascular: Positive for bilateral rib soreness. Denies chest pain. Respiratory: Positive for occasional wheezing and shortness of breath. Gastrointestinal: No abdominal pain.  No nausea, no vomiting.  No diarrhea.  No constipation. Genitourinary: Negative for dysuria. Musculoskeletal: Negative for back pain. Skin: Positive for eczematous rash. Neurological: Negative for headaches, focal weakness or numbness.   ____________________________________________   PHYSICAL EXAM:  VITAL SIGNS: ED Triage Vitals  Enc Vitals Group     BP 09/23/16 2343 138/89     Pulse Rate 09/23/16 2343 99  Resp 09/23/16 2343 20     Temp 09/23/16 2343 98.8 F (37.1 C)     Temp Source 09/23/16 2343 Oral     SpO2 09/23/16 2343 100 %     Weight 09/23/16 2343 240 lb (108.9 kg)     Height 09/23/16 2343 5\' 8"  (1.727 m)     Head Circumference --      Peak Flow --      Pain Score 09/23/16 2342 8     Pain Loc --       Pain Edu? --      Excl. in GC? --     Constitutional: Alert and oriented. Well appearing and in no acute distress. Eyes: Conjunctivae are normal. PERRL. EOMI. Head: Atraumatic. Nose: Congestion/rhinnorhea. Mouth/Throat: Mucous membranes are moist.  Oropharynx mildly erythematous without tonsillar exudates or peritonsillar abscess. Chronically enlarged tonsils bilaterally. There is no hoarse or muffled voice. There is no drooling. Neck: No stridor.   Hematological/Lymphatic/Immunilogical: No cervical lymphadenopathy. Cardiovascular: Normal rate, regular rhythm. Grossly normal heart sounds.  Good peripheral circulation. Respiratory: Normal respiratory effort.  No retractions. Lungs CTAB. Active dry cough. Gastrointestinal: Soft and nontender. No distention. No abdominal bruits. No CVA tenderness. Musculoskeletal: No lower extremity tenderness nor edema.  No joint effusions. Neurologic:  Normal speech and language. No gross focal neurologic deficits are appreciated. No gait instability. Skin:  Skin is warm, dry and intact. Eczematous, dry, flaky rash noted to bilateral palms and soles. Psychiatric: Mood and affect are normal. Speech and behavior are normal.  ____________________________________________   LABS (all labs ordered are listed, but only abnormal results are displayed)  Labs Reviewed - No data to display ____________________________________________  EKG  None ____________________________________________  RADIOLOGY  Dg Chest 2 View  Result Date: 09/24/2016 CLINICAL DATA:  Persistent cough EXAM: CHEST  2 VIEW COMPARISON:  None. FINDINGS: The heart size and mediastinal contours are within normal limits. Both lungs are clear. The visualized skeletal structures are unremarkable. IMPRESSION: No active cardiopulmonary disease. Electronically Signed   By: Jasmine Pang M.D.   On: 09/24/2016 00:02    ____________________________________________   PROCEDURES  Procedure(s)  performed: None  Procedures  Critical Care performed: No  ____________________________________________   INITIAL IMPRESSION / ASSESSMENT AND PLAN / ED COURSE  Pertinent labs & imaging results that were available during my care of the patient were reviewed by me and considered in my medical decision making (see chart for details).  31 year old female who presents with orchitis and flareup of eczematous rash. Actively coughing on exam. Will administer albuterol nebulizer for coughing spasm, initiate prednisone and prescribe triamcinolone cream. Strict return precautions given. Patient verbalizes understanding and agrees with plan of care.      ____________________________________________   FINAL CLINICAL IMPRESSION(S) / ED DIAGNOSES  Final diagnoses:  Acute bronchitis, unspecified organism  Eczema, unspecified type      NEW MEDICATIONS STARTED DURING THIS VISIT:  New Prescriptions   ALBUTEROL (PROVENTIL HFA;VENTOLIN HFA) 108 (90 BASE) MCG/ACT INHALER    Inhale 2 puffs into the lungs every 4 (four) hours as needed for wheezing or shortness of breath.   CHLORPHENIRAMINE-HYDROCODONE (TUSSIONEX PENNKINETIC ER) 10-8 MG/5ML SUER    Take 5 mLs by mouth 2 (two) times daily.   PREDNISONE (DELTASONE) 20 MG TABLET    3 tablets daily x 4 days   TRIAMCINOLONE ACETONIDE (TRIAMCINOLONE 0.1 % CREAM : EUCERIN) CREA    Apply 1 application topically 3 (three) times daily as needed.     Note:  This  document was prepared using Conservation officer, historic buildingsDragon voice recognition software and may include unintentional dictation errors.    Irean HongSung, Aracelie Addis J, MD 09/24/16 [phone]

## 2017-08-29 ENCOUNTER — Ambulatory Visit (INDEPENDENT_AMBULATORY_CARE_PROVIDER_SITE_OTHER): Payer: Self-pay | Admitting: Psychiatry

## 2017-08-29 ENCOUNTER — Encounter (HOSPITAL_COMMUNITY): Payer: Self-pay | Admitting: Psychiatry

## 2017-08-29 VITALS — BP 138/86 | HR 101 | Ht 68.5 in | Wt 292.5 lb

## 2017-08-29 DIAGNOSIS — F411 Generalized anxiety disorder: Secondary | ICD-10-CM

## 2017-08-29 DIAGNOSIS — F410 Panic disorder [episodic paroxysmal anxiety] without agoraphobia: Secondary | ICD-10-CM

## 2017-08-29 DIAGNOSIS — Z813 Family history of other psychoactive substance abuse and dependence: Secondary | ICD-10-CM

## 2017-08-29 DIAGNOSIS — Z818 Family history of other mental and behavioral disorders: Secondary | ICD-10-CM

## 2017-08-29 DIAGNOSIS — F17210 Nicotine dependence, cigarettes, uncomplicated: Secondary | ICD-10-CM

## 2017-08-29 DIAGNOSIS — F331 Major depressive disorder, recurrent, moderate: Secondary | ICD-10-CM

## 2017-08-29 MED ORDER — PROPRANOLOL HCL 10 MG PO TABS: 10.0000 mg | ORAL_TABLET | Freq: Two times a day (BID) | ORAL | 1 refills | Status: DC | PRN

## 2017-08-29 MED ORDER — ESCITALOPRAM OXALATE 20 MG PO TABS: 20.0000 mg | ORAL_TABLET | Freq: Every day | ORAL | 2 refills | Status: DC
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
## 2017-09-24 ENCOUNTER — Ambulatory Visit (HOSPITAL_COMMUNITY): Payer: Self-pay | Admitting: Psychiatry

## 2017-10-08 ENCOUNTER — Other Ambulatory Visit: Payer: Self-pay

## 2017-10-08 ENCOUNTER — Emergency Department
Admission: EM | Admit: 2017-10-08 | Discharge: 2017-10-08 | Disposition: A | Discharge status: Home / Self Care | Payer: Self-pay | Attending: Emergency Medicine | Admitting: Emergency Medicine

## 2017-10-08 DIAGNOSIS — Z79899 Other long term (current) drug therapy: Secondary | ICD-10-CM | POA: Insufficient documentation

## 2017-10-08 DIAGNOSIS — W57XXXA Bitten or stung by nonvenomous insect and other nonvenomous arthropods, initial encounter: Secondary | ICD-10-CM | POA: Insufficient documentation

## 2017-10-08 DIAGNOSIS — R600 Localized edema: Secondary | ICD-10-CM | POA: Insufficient documentation

## 2017-10-08 DIAGNOSIS — F17200 Nicotine dependence, unspecified, uncomplicated: Secondary | ICD-10-CM | POA: Insufficient documentation

## 2017-10-08 DIAGNOSIS — I10 Essential (primary) hypertension: Secondary | ICD-10-CM | POA: Insufficient documentation

## 2017-10-08 MED ORDER — IBUPROFEN 600 MG PO TABS: 600.0000 mg | ORAL_TABLET | Freq: Three times a day (TID) | ORAL | 0 refills | Status: AC | PRN

## 2017-10-08 MED ORDER — OXYCODONE-ACETAMINOPHEN 7.5-325 MG PO TABS: 1.0000 | ORAL_TABLET | Freq: Four times a day (QID) | ORAL | 0 refills | Status: AC | PRN

## 2017-10-08 MED ORDER — SULFAMETHOXAZOLE-TRIMETHOPRIM 800-160 MG PO TABS: 1.0000 | ORAL_TABLET | Freq: Two times a day (BID) | ORAL | 0 refills | Status: AC

## 2017-10-08 NOTE — ED Notes (Signed)
Has history of boils, but thinks she may have had a bug bite.  Right groin area.  Also has some others that she  Has been treating at home.  Says she usually gets prednisone, but is not on it now.

## 2017-10-08 NOTE — ED Triage Notes (Signed)
Pt states she went to the went to the beach Sunday and noted small red area to right groin. States now she feels like it is infected, unsure of insect bite.

## 2017-10-08 NOTE — Discharge Instructions (Signed)
Follow discharge care instruction take medication as directed.  Advised daily use of antibacterial soap.

## 2017-10-08 NOTE — ED Provider Notes (Signed)
[facility] Emergency Department Provider Note   ____________________________________________   First MD Initiated Contact with Patient 10/08/17 0732     (approximate)  I have reviewed the triage vital signs and the nursing notes.   HISTORY  Chief Complaint Insect Bite    HPI Laurie Stewart is a 32 y.o. female patient states after returning from the beach she noticed small papular lesion in the right groin area.  Patient state area is red, edematous erythematous and started draining this morning.  Patient states area is painful and she rates as a 10/10.  Patient described the pain is "aching".  Except for dry dressings no other palliative measures for complaint.  Past Medical History:  Diagnosis Date  . Eczema   . Obesity affecting pregnancy     Patient Active Problem List   Diagnosis Date Noted  . MDD (major depressive disorder), recurrent episode, moderate (HCC) 08/29/2017  . GAD (generalized anxiety disorder) 08/29/2017  . Labor and delivery, indication for care 12/19/2014  . Hypertension affecting pregnancy 12/18/2014    Past Surgical History:  Procedure Laterality Date  . NO PAST SURGERIES      Prior to Admission medications   Medication Sig Start Date End Date Taking? Authorizing Provider  escitalopram (LEXAPRO) 20 MG tablet Take 1 tablet (20 mg total) by mouth daily. 08/29/17 08/29/18 Yes Oletta Darter, MD  etonogestrel (NEXPLANON) 68 MG IMPL implant 1 each by Subdermal route once.   Yes [provider]  propranolol (INDERAL) 10 MG tablet Take 1 tablet (10 mg total) by mouth 2 (two) times daily as needed. 08/29/17  Yes Oletta Darter, MD  ibuprofen (ADVIL,MOTRIN) 600 MG tablet Take 1 tablet (600 mg total) by mouth every 8 (eight) hours as needed. 10/08/17   Joni Reining, PA-C  oxyCODONE-acetaminophen (PERCOCET) 7.5-325 MG tablet Take 1 tablet by mouth every 6 (six) hours as needed for severe pain. 10/08/17   Joni Reining, PA-C   sulfamethoxazole-trimethoprim (BACTRIM DS,SEPTRA DS) 800-160 MG tablet Take 1 tablet by mouth 2 (two) times daily. 10/08/17   Joni Reining, PA-C    Allergies Patient has no known allergies.  Family History  Problem Relation Age of Onset  . Arthritis Father   . Drug abuse Father   . Bipolar disorder Mother     Social History Social History   Tobacco Use  . Smoking status: Current Every Day Smoker    Packs/day: 1.00    Last attempt to quit: 05/19/2014    Years since quitting: 3.3  . Smokeless tobacco: Never Used  Substance Use Topics  . Alcohol use: Yes    Comment: rare- 2 mixed drinks a month  . Drug use: No    Review of Systems Constitutional: No fever/chills Eyes: No visual changes. ENT: No sore throat. Cardiovascular: Denies chest pain. Respiratory: Denies shortness of breath. Gastrointestinal: No abdominal pain.  No nausea, no vomiting.  No diarrhea.  No constipation. Genitourinary: Negative for dysuria. Musculoskeletal: Negative for back pain. Skin: Positive for rash. Neurological: Negative for headaches, focal weakness or numbness.   ____________________________________________   PHYSICAL EXAM:  VITAL SIGNS: ED Triage Vitals  Enc Vitals Group     BP 10/08/17 0608 (!) 132/92     Pulse Rate 10/08/17 0608 82     Resp 10/08/17 0608 20     Temp 10/08/17 0608 98.2 F (36.8 C)     Temp Source 10/08/17 0608 Oral     SpO2 10/08/17 0608 99 %  Weight 10/08/17 0609 292 lb (132.5 kg)     Height 10/08/17 0609 5\' 8"  (1.727 m)     Head Circumference --      Peak Flow --      Pain Score 10/08/17 0609 10     Pain Loc --      Pain Edu? --      Excl. in GC? --     Constitutional: Alert and oriented. Well appearing and in no acute distress. Cardiovascular: Normal rate, regular rhythm. Grossly normal heart sounds.  Good peripheral circulation. Respiratory: Normal respiratory effort.  No retractions. Lungs CTAB. Musculoskeletal: No lower extremity tenderness  nor edema.  No joint effusions. Neurologic:  Normal speech and language. No gross focal neurologic deficits are appreciated. No gait instability. Skin: Edematous erythematous nodule lesion right inguinal area with drainage.  Psychiatric: Mood and affect are normal. Speech and behavior are normal.  ____________________________________________   LABS (all labs ordered are listed, but only abnormal results are displayed)  Labs Reviewed - No data to display ____________________________________________  EKG   ____________________________________________  RADIOLOGY  ED MD interpretation:    Official radiology report(s): No results found.  ____________________________________________   PROCEDURES  Procedure(s) performed: None  Procedures  Critical Care performed: No  ____________________________________________   INITIAL IMPRESSION / ASSESSMENT AND PLAN / ED COURSE  As part of my medical decision making, I reviewed the following data within the electronic MEDICAL RECORD NUMBER    Infected insect bite right inguinal area.  Patient given discharge care instructions advised take medication as directed.  Patient [facility]      ____________________________________________   FINAL CLINICAL IMPRESSION(S) / ED DIAGNOSES  Final diagnoses:  Bug bite with infection, initial encounter     ED Discharge Orders        Ordered    sulfamethoxazole-trimethoprim (BACTRIM DS,SEPTRA DS) 800-160 MG tablet  2 times daily     10/08/17 0748    oxyCODONE-acetaminophen (PERCOCET) 7.5-325 MG tablet  Every 6 hours PRN     10/08/17 0748    ibuprofen (ADVIL,MOTRIN) 600 MG tablet  Every 8 hours PRN     10/08/17 0748       Note:  This document was prepared using Dragon voice recognition software and may include unintentional dictation errors.    Joni ReiningSmith, Quintavia Rogstad K, PA-C 10/08/17 [id number]    Emily FilbertWilliams, Jonathan E, MD 10/08/17 [phone]

## 2017-10-15 ENCOUNTER — Ambulatory Visit (INDEPENDENT_AMBULATORY_CARE_PROVIDER_SITE_OTHER): Payer: Self-pay | Admitting: Psychiatry

## 2017-10-15 ENCOUNTER — Encounter (HOSPITAL_COMMUNITY): Payer: Self-pay | Admitting: Psychiatry

## 2017-10-15 DIAGNOSIS — F411 Generalized anxiety disorder: Secondary | ICD-10-CM

## 2017-10-15 DIAGNOSIS — F331 Major depressive disorder, recurrent, moderate: Secondary | ICD-10-CM

## 2017-10-15 MED ORDER — ESCITALOPRAM OXALATE 20 MG PO TABS: 20.0000 mg | ORAL_TABLET | Freq: Every day | ORAL | 2 refills | Status: DC

## 2017-10-15 MED ORDER — CLONIDINE HCL 0.1 MG PO TABS: 0.1000 mg | ORAL_TABLET | Freq: Three times a day (TID) | ORAL | 1 refills | Status: DC | PRN
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
## 2017-11-26 ENCOUNTER — Ambulatory Visit (INDEPENDENT_AMBULATORY_CARE_PROVIDER_SITE_OTHER): Payer: Self-pay | Admitting: Psychiatry

## 2017-11-26 DIAGNOSIS — Z818 Family history of other mental and behavioral disorders: Secondary | ICD-10-CM

## 2017-11-26 DIAGNOSIS — F411 Generalized anxiety disorder: Secondary | ICD-10-CM

## 2017-11-26 DIAGNOSIS — F331 Major depressive disorder, recurrent, moderate: Secondary | ICD-10-CM

## 2017-11-26 DIAGNOSIS — F17210 Nicotine dependence, cigarettes, uncomplicated: Secondary | ICD-10-CM

## 2017-11-26 MED ORDER — DIVALPROEX SODIUM ER 500 MG PO TB24: 500.0000 mg | ORAL_TABLET | Freq: Every day | ORAL | 1 refills | Status: AC

## 2017-11-26 MED ORDER — ESCITALOPRAM OXALATE 20 MG PO TABS: 20.0000 mg | ORAL_TABLET | Freq: Every day | ORAL | 1 refills | Status: AC
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
## 2017-11-29 ENCOUNTER — Emergency Department: Payer: Self-pay

## 2017-11-29 ENCOUNTER — Emergency Department
Admission: EM | Admit: 2017-11-29 | Discharge: 2017-11-30 | Disposition: A | Discharge status: Home / Self Care | Payer: Self-pay | Attending: Emergency Medicine | Admitting: Emergency Medicine

## 2017-11-29 ENCOUNTER — Other Ambulatory Visit: Payer: Self-pay

## 2017-11-29 DIAGNOSIS — R000 Tachycardia, unspecified: Secondary | ICD-10-CM | POA: Insufficient documentation

## 2017-11-29 DIAGNOSIS — F17210 Nicotine dependence, cigarettes, uncomplicated: Secondary | ICD-10-CM | POA: Insufficient documentation

## 2017-11-29 DIAGNOSIS — Z79899 Other long term (current) drug therapy: Secondary | ICD-10-CM | POA: Insufficient documentation

## 2017-11-29 DIAGNOSIS — R0602 Shortness of breath: Secondary | ICD-10-CM | POA: Insufficient documentation

## 2017-11-29 DIAGNOSIS — J40 Bronchitis, not specified as acute or chronic: Secondary | ICD-10-CM | POA: Insufficient documentation

## 2017-11-29 HISTORY — DX: Unspecified asthma, uncomplicated: J45.909

## 2017-11-29 MED ORDER — IPRATROPIUM-ALBUTEROL 0.5-2.5 (3) MG/3ML IN SOLN
3.0000 mL | Freq: Once | RESPIRATORY_TRACT | Status: AC
  Administered 2017-11-29: 3 mL via RESPIRATORY_TRACT

## 2017-11-29 MED ORDER — IPRATROPIUM-ALBUTEROL 0.5-2.5 (3) MG/3ML IN SOLN
RESPIRATORY_TRACT | Status: AC
  Administered 2017-11-29: 3 mL via RESPIRATORY_TRACT
  Filled 2017-11-29: qty 3

## 2017-11-29 MED ORDER — SODIUM CHLORIDE 0.9 % IV BOLUS
1000.0000 mL | Freq: Once | INTRAVENOUS | Status: AC
  Administered 2017-11-30: 1000 mL via INTRAVENOUS

## 2017-11-29 NOTE — ED Provider Notes (Signed)
[facility] Emergency Department Provider Note   ____________________________________________   First MD Initiated Contact with Patient 11/29/17 2334     (approximate)  I have reviewed the triage vital signs and the nursing notes.   HISTORY  Chief Complaint Respiratory Distress    HPI Laurie Stewart is a 32 y.o. female who comes into the hospital today with some shortness of breath.  She states that she woke up feeling sick and she is been getting worse.  The patient has had a cough which she reports is mildly productive but she did not see the color of the sputum.  She is had fevers on and off today and some chills.  She is woken up with sweats as well.  The patient denies any sick contacts.  She has a history of asthma but has not used an inhaler in a long time.  She was taken off of medicines when she was 18.  The patient states that she feels tight in her chest.  She did have some posttussive emesis and she does have a sore throat.  She is here today for evaluation of her symptoms.   Past Medical History:  Diagnosis Date  . Asthma   . Eczema   . Obesity affecting pregnancy     Patient Active Problem List   Diagnosis Date Noted  . MDD (major depressive disorder), recurrent episode, moderate (HCC) 08/29/2017  . GAD (generalized anxiety disorder) 08/29/2017  . Labor and delivery, indication for care 12/19/2014  . Hypertension affecting pregnancy 12/18/2014    Past Surgical History:  Procedure Laterality Date  . NO PAST SURGERIES      Prior to Admission medications   Medication Sig Start Date End Date Taking? Authorizing Provider  albuterol (PROVENTIL HFA;VENTOLIN HFA) 108 (90 Base) MCG/ACT inhaler Inhale 2 puffs into the lungs every 6 (six) hours as needed. 11/30/17   Rebecka Apley, MD  azithromycin (ZITHROMAX Z-PAK) 250 MG tablet Take 2 tablets (500 mg) on  Day 1,  followed by 1 tablet (250 mg) once daily on Days 2 through 5. 11/30/17 12/05/17   Rebecka Apley, MD  divalproex (DEPAKOTE ER) 500 MG 24 hr tablet Take 1 tablet (500 mg total) by mouth daily. 11/26/17   Oletta Darter, MD  escitalopram (LEXAPRO) 20 MG tablet Take 1 tablet (20 mg total) by mouth daily. 11/26/17 11/26/18  Oletta Darter, MD  etonogestrel (NEXPLANON) 68 MG IMPL implant 1 each by Subdermal route once.    [provider]  ibuprofen (ADVIL,MOTRIN) 600 MG tablet Take 1 tablet (600 mg total) by mouth every 8 (eight) hours as needed. Patient not taking: Reported on 11/26/2017 10/08/17   Joni Reining, PA-C  oxyCODONE-acetaminophen (PERCOCET) 7.5-325 MG tablet Take 1 tablet by mouth every 6 (six) hours as needed for severe pain. Patient not taking: Reported on 11/26/2017 10/08/17   Joni Reining, PA-C  predniSONE (STERAPRED UNI-PAK 21 TAB) 10 MG (21) TBPK tablet Take 6 tabs on day 1 Take 5 tabs on day 2 Take 4 tabs on day 3 Take 3 tabs on day 4 Take 2 tabs on day 5 Take 1 tab on day 6 11/30/17   Rebecka Apley, MD  sulfamethoxazole-trimethoprim (BACTRIM DS,SEPTRA DS) 800-160 MG tablet Take 1 tablet by mouth 2 (two) times daily. Patient not taking: Reported on 11/26/2017 10/08/17   Joni Reining, PA-C    Allergies Patient has no known allergies.  Family History  Problem Relation Age of Onset  .  Arthritis Father   . Drug abuse Father   . Bipolar disorder Mother     Social History Social History   Tobacco Use  . Smoking status: Current Every Day Smoker    Packs/day: 1.00    Last attempt to quit: 05/19/2014    Years since quitting: 3.5  . Smokeless tobacco: Never Used  Substance Use Topics  . Alcohol use: Yes    Comment: rare- 2 mixed drinks a month  . Drug use: No    Review of Systems  Constitutional: fever/chills Eyes: No visual changes. ENT: sore throat. Cardiovascular: Chest tightness Respiratory: shortness of breath. Gastrointestinal: No abdominal pain.  No nausea, no vomiting.  No diarrhea.  No constipation. Genitourinary:  Negative for dysuria. Musculoskeletal: Negative for back pain. Skin: Negative for rash. Neurological: Negative for headaches, focal weakness or numbness.   ____________________________________________   PHYSICAL EXAM:  VITAL SIGNS: ED Triage Vitals  Enc Vitals Group     BP 11/29/17 2325 (!) 140/91     Pulse Rate 11/29/17 2323 (!) 118     Resp 11/29/17 2323 (!) 24     Temp 11/29/17 2323 98.5 F (36.9 C)     Temp Source 11/29/17 2323 Oral     SpO2 11/29/17 2323 98 %     Weight 11/29/17 2323 230 lb (104.3 kg)     Height 11/29/17 2323 5\' 8"  (1.727 m)     Head Circumference --      Peak Flow --      Pain Score 11/29/17 2323 10     Pain Loc --      Pain Edu? --      Excl. in GC? --     Constitutional: Alert and oriented. Ill appearing and in moderate distress. Eyes: Conjunctivae are normal. PERRL. EOMI. Head: Atraumatic. Nose: No congestion/rhinnorhea. Mouth/Throat: Mucous membranes are moist.  Oropharynx erythematous, with enlarged tonsils and tonsillar exudate Lymph: Enlarged cervical lymphadenopathy Cardiovascular: Tachycardia, regular rhythm. Grossly normal heart sounds.  Good peripheral circulation. Respiratory: increased respiratory effort.  No retractions.  Tight expiratory wheezes in all lung fields and some mild rhonchi. Gastrointestinal: Soft and nontender. No distention.  Positive bowel sounds Musculoskeletal: No lower extremity tenderness nor edema.  Neurologic:  Normal speech and language.  Skin:  Skin is warm, dry and intact.  Psychiatric: Mood and affect are normal.   ____________________________________________   LABS (all labs ordered are listed, but only abnormal results are displayed)  Labs Reviewed  CBC WITH DIFFERENTIAL/PLATELET - Abnormal; Notable for the following components:      Result Value   WBC 19.5 (*)    Neutro Abs 17.0 (*)    Monocytes Absolute 1.3 (*)    All other components within normal limits  BASIC METABOLIC PANEL - Abnormal;  Notable for the following components:   Potassium 3.4 (*)    Glucose, Bld 127 (*)    Calcium 8.5 (*)    All other components within normal limits  GROUP A STREP BY PCR  TROPONIN I  MONONUCLEOSIS SCREEN  POC URINE PREG, ED  POCT PREGNANCY, URINE   ____________________________________________  EKG  ED ECG REPORT I, Rebecka ApleyWebster,  Maille Halliwell P, the attending physician, personally viewed and interpreted this ECG.   Date: 11/29/2017  EKG Time: 2356  Rate: 98  Rhythm: normal sinus rhythm  Axis: Normal  Intervals:none  ST&T Change: Flipped T waves in leads II, III and aVF  ____________________________________________  RADIOLOGY  ED MD interpretation:  CXR: Mild increased peribronchial changes likely  related to some early bronchitis. No focal acute infiltrate noted.  Official radiology report(s): Dg Chest 2 View  Result Date: 11/30/2017 CLINICAL DATA:  Difficulty breathing EXAM: CHEST - 2 VIEW COMPARISON:  09/23/2016 FINDINGS: Cardiac shadows within normal limits. The lungs are well aerated bilaterally. Mild increased peribronchial changes are noted without focal infiltrate. No sizable effusion is seen. IMPRESSION: Mild increased peribronchial changes likely related to some early bronchitis. No focal acute infiltrate is noted. Electronically Signed   By: Alcide Clever M.D.   On: 11/30/2017 00:35    ____________________________________________   PROCEDURES  Procedure(s) performed: None  Procedures  Critical Care performed: No  ____________________________________________   INITIAL IMPRESSION / ASSESSMENT AND PLAN / ED COURSE  As part of my medical decision making, I reviewed the following data within the electronic MEDICAL RECORD NUMBER Notes from prior ED visits and [place] Controlled Substance Database   This is a 32 year old female who comes into the hospital today with some shortness of breath.  My differential diagnosis includes bronchitis, asthma, strep throat, pneumonia  I  did give the patient a DuoNeb treatment which did help improve her shortness of breath.  I also ordered some blood work and a liter of normal saline.  The patient will receive a chest x-ray, CBC, BMP, troponin and a strep test.  Once I receive the results I will reassess the patient to determine her further treatment.  The patient did receive her liter of normal saline but did have another coughing fit.  Her strep and her mono were both negative.  The patient's white blood cell count was elevated at 19.5.  I did give the patient some Solu-Medrol and azithromycin with a concern for bronchitis.  She received a second neb but then had some tachycardia.  We gave her a 500 mm bolus of normal saline but the patient did remain tachycardic around 105.  The patient states though that she is ready to go home.  Her significant other has to go to work and she is been here for over 6 hours.  She reports that she does feel better and she starting to feel hungry.  I did give the patient some ginger ale and crackers.  Although the patient is still tachycardic she reports that she is ready to go home.  The patient will be discharged home and encouraged to follow-up with the acute care clinic.  She should return with any worsening symptoms or any other concerns.      ____________________________________________   FINAL CLINICAL IMPRESSION(S) / ED DIAGNOSES  Final diagnoses:  Bronchitis  Shortness of breath  Tachycardia     ED Discharge Orders         Ordered    azithromycin (ZITHROMAX Z-PAK) 250 MG tablet     11/30/17 0455    predniSONE (STERAPRED UNI-PAK 21 TAB) 10 MG (21) TBPK tablet     11/30/17 0455    albuterol (PROVENTIL HFA;VENTOLIN HFA) 108 (90 Base) MCG/ACT inhaler  Every 6 hours PRN     11/30/17 0455           Note:  This document was prepared using Dragon voice recognition software and may include unintentional dictation errors.    Rebecka Apley, MD 11/30/17 [phone]

## 2017-11-29 NOTE — ED Triage Notes (Signed)
Patient to ED for difficulty breathing. Cough in triage but is nonproductive. Fever and chills. Onset this morning.

## 2017-11-30 LAB — CBC WITH DIFFERENTIAL/PLATELET
Basophils Absolute: 0.1 K/uL (ref 0–0.1)
Basophils Relative: 0 %
Eosinophils Absolute: 0.1 K/uL (ref 0–0.7)
Eosinophils Relative: 0 %
HCT: 43.4 % (ref 35.0–47.0)
Hemoglobin: 14.8 g/dL (ref 12.0–16.0)
Lymphocytes Relative: 6 %
Lymphs Abs: 1.1 K/uL (ref 1.0–3.6)
MCH: 31.3 pg (ref 26.0–34.0)
MCHC: 34.2 g/dL (ref 32.0–36.0)
MCV: 91.7 fL (ref 80.0–100.0)
Monocytes Absolute: 1.3 K/uL — ABNORMAL HIGH (ref 0.2–0.9)
Monocytes Relative: 7 %
Neutro Abs: 17 K/uL — ABNORMAL HIGH (ref 1.4–6.5)
Neutrophils Relative %: 87 %
Platelets: 311 K/uL (ref 150–440)
RBC: 4.74 MIL/uL (ref 3.80–5.20)
RDW: 13.8 % (ref 11.5–14.5)
WBC: 19.5 K/uL — ABNORMAL HIGH (ref 3.6–11.0)

## 2017-11-30 LAB — BASIC METABOLIC PANEL
Anion gap: 8 (ref 5–15)
BUN: 6 mg/dL (ref 6–20)
CHLORIDE: 105 mmol/L (ref 98–111)
CO2: 26 mmol/L (ref 22–32)
Calcium: 8.5 mg/dL — ABNORMAL LOW (ref 8.9–10.3)
Creatinine, Ser: 0.77 mg/dL (ref 0.44–1.00)
GFR calc Af Amer: >60 mL/min (ref >60)
GFR calc non Af Amer: >60 mL/min (ref >60)
GLUCOSE: 127 mg/dL — AB (ref 70–99)
POTASSIUM: 3.4 mmol/L — AB (ref 3.5–5.1)
Sodium: 139 mmol/L (ref 135–145)

## 2017-11-30 LAB — GROUP A STREP BY PCR: Group A Strep by PCR: NOT DETECTED

## 2017-11-30 LAB — MONONUCLEOSIS SCREEN: Mono Screen: NEGATIVE

## 2017-11-30 LAB — TROPONIN I: Troponin I: <0.03 ng/mL

## 2017-11-30 LAB — POCT PREGNANCY, URINE: Preg Test, Ur: NEGATIVE

## 2017-11-30 MED ORDER — SODIUM CHLORIDE 0.9 % IV BOLUS
500.0000 mL | Freq: Once | INTRAVENOUS | Status: AC
  Administered 2017-11-30: 500 mL via INTRAVENOUS

## 2017-11-30 MED ORDER — IPRATROPIUM-ALBUTEROL 0.5-2.5 (3) MG/3ML IN SOLN
3.0000 mL | Freq: Once | RESPIRATORY_TRACT | Status: AC
  Administered 2017-11-30: 3 mL via RESPIRATORY_TRACT

## 2017-11-30 MED ORDER — ALBUTEROL SULFATE HFA 108 (90 BASE) MCG/ACT IN AERS: 2.0000 | INHALATION_SPRAY | Freq: Four times a day (QID) | RESPIRATORY_TRACT | 0 refills | Status: AC | PRN

## 2017-11-30 MED ORDER — AZITHROMYCIN 250 MG PO TABS: ORAL_TABLET | ORAL | 0 refills | Status: AC

## 2017-11-30 MED ORDER — METHYLPREDNISOLONE SODIUM SUCC 125 MG IJ SOLR
125.0000 mg | Freq: Once | INTRAMUSCULAR | Status: AC
  Administered 2017-11-30: 125 mg via INTRAVENOUS
  Filled 2017-11-30: qty 2

## 2017-11-30 MED ORDER — PREDNISONE 10 MG (21) PO TBPK: ORAL_TABLET | ORAL | 0 refills | Status: DC

## 2017-11-30 MED ORDER — AZITHROMYCIN 500 MG PO TABS
500.0000 mg | ORAL_TABLET | Freq: Once | ORAL | Status: AC
  Administered 2017-11-30: 500 mg via ORAL
  Filled 2017-11-30: qty 1

## 2017-11-30 MED ORDER — IPRATROPIUM-ALBUTEROL 0.5-2.5 (3) MG/3ML IN SOLN
RESPIRATORY_TRACT | Status: AC
  Filled 2017-11-30: qty 3

## 2017-11-30 NOTE — Discharge Instructions (Addendum)
Please follow up with the acute care clinic. Please return with any worsened symptoms or any worsened condition.

## 2017-12-03 ENCOUNTER — Emergency Department
Admission: EM | Admit: 2017-12-03 | Discharge: 2017-12-03 | Disposition: A | Discharge status: Home / Self Care | Payer: Self-pay | Attending: Emergency Medicine | Admitting: Emergency Medicine

## 2017-12-03 ENCOUNTER — Emergency Department: Payer: Self-pay

## 2017-12-03 DIAGNOSIS — F17200 Nicotine dependence, unspecified, uncomplicated: Secondary | ICD-10-CM | POA: Insufficient documentation

## 2017-12-03 DIAGNOSIS — J45909 Unspecified asthma, uncomplicated: Secondary | ICD-10-CM | POA: Insufficient documentation

## 2017-12-03 DIAGNOSIS — Z79899 Other long term (current) drug therapy: Secondary | ICD-10-CM | POA: Insufficient documentation

## 2017-12-03 DIAGNOSIS — F329 Major depressive disorder, single episode, unspecified: Secondary | ICD-10-CM | POA: Insufficient documentation

## 2017-12-03 DIAGNOSIS — F419 Anxiety disorder, unspecified: Secondary | ICD-10-CM | POA: Insufficient documentation

## 2017-12-03 DIAGNOSIS — J209 Acute bronchitis, unspecified: Secondary | ICD-10-CM | POA: Insufficient documentation

## 2017-12-03 LAB — CBC WITH DIFFERENTIAL/PLATELET
Basophils Absolute: 0.1 10*3/uL (ref 0–0.1)
Basophils Relative: 1 %
Eosinophils Absolute: 0 10*3/uL (ref 0–0.7)
Eosinophils Relative: 0 %
HEMATOCRIT: 43.3 % (ref 35.0–47.0)
Hemoglobin: 14.8 g/dL (ref 12.0–16.0)
LYMPHS ABS: 2.7 10*3/uL (ref 1.0–3.6)
LYMPHS PCT: 19 %
MCH: 31.5 pg (ref 26.0–34.0)
MCHC: 34.1 g/dL (ref 32.0–36.0)
MCV: 92.4 fL (ref 80.0–100.0)
MONOS PCT: 10 %
Monocytes Absolute: 1.4 10*3/uL — ABNORMAL HIGH (ref 0.2–0.9)
NEUTROS PCT: 70 %
Neutro Abs: 10.4 10*3/uL — ABNORMAL HIGH (ref 1.4–6.5)
Platelets: 314 10*3/uL (ref 150–440)
RBC: 4.69 MIL/uL (ref 3.80–5.20)
RDW: 13.7 % (ref 11.5–14.5)
WBC: 14.6 10*3/uL — AB (ref 3.6–11.0)

## 2017-12-03 LAB — BASIC METABOLIC PANEL
Anion gap: 7 (ref 5–15)
BUN: 10 mg/dL (ref 6–20)
CHLORIDE: 108 mmol/L (ref 98–111)
CO2: 24 mmol/L (ref 22–32)
Calcium: 8.5 mg/dL — ABNORMAL LOW (ref 8.9–10.3)
Creatinine, Ser: 0.76 mg/dL (ref 0.44–1.00)
GFR calc Af Amer: >60 mL/min (ref >60)
GFR calc non Af Amer: >60 mL/min (ref >60)
Glucose, Bld: 98 mg/dL (ref 70–99)
POTASSIUM: 5 mmol/L (ref 3.5–5.1)
Sodium: 139 mmol/L (ref 135–145)

## 2017-12-03 LAB — TROPONIN I

## 2017-12-03 MED ORDER — IPRATROPIUM-ALBUTEROL 0.5-2.5 (3) MG/3ML IN SOLN
3.0000 mL | Freq: Once | RESPIRATORY_TRACT | Status: AC
  Administered 2017-12-03: 3 mL via RESPIRATORY_TRACT
  Filled 2017-12-03: qty 3

## 2017-12-03 MED ORDER — ALBUTEROL SULFATE HFA 108 (90 BASE) MCG/ACT IN AERS: 2.0000 | INHALATION_SPRAY | Freq: Four times a day (QID) | RESPIRATORY_TRACT | 2 refills | Status: AC | PRN

## 2017-12-03 MED ORDER — PREDNISONE 20 MG PO TABS: 60.0000 mg | ORAL_TABLET | Freq: Every day | ORAL | 0 refills | Status: AC

## 2017-12-03 MED ORDER — METHYLPREDNISOLONE SODIUM SUCC 125 MG IJ SOLR
125.0000 mg | Freq: Once | INTRAMUSCULAR | Status: AC
  Administered 2017-12-03: 125 mg via INTRAVENOUS
  Filled 2017-12-03 (×2): qty 2

## 2017-12-03 MED ORDER — BENZONATATE 100 MG PO CAPS: 100.0000 mg | ORAL_CAPSULE | Freq: Three times a day (TID) | ORAL | 0 refills | Status: AC | PRN

## 2017-12-03 MED ORDER — AMOXICILLIN-POT CLAVULANATE 875-125 MG PO TABS: 1.0000 | ORAL_TABLET | Freq: Two times a day (BID) | ORAL | 0 refills | Status: AC

## 2017-12-03 NOTE — ED Provider Notes (Signed)
[facility] Emergency Department Provider Note  ____________________________________________  Time seen: Approximately 8:20 PM  I have reviewed the triage vital signs and the nursing notes.   HISTORY  Chief Complaint Cough   HPI Laurie Stewart is a 32 y.o. female with a history of asthma and smoking who presents for evaluation of shortness of breath and cough.  Patient reports 5 days of cough productive of yellow phlegm, shortness of breath, and chest pain.  She describes the chest pain as a squeezing sensation located in the center of her chest when she takes a deep breath or coughs.  No pleuritic chest pain.  She has had shortness of breath both at rest which gets worse with minimal exertion.  Patient reports that she coughs so hard that she sometimes sees spots and feels like she is going to pass out.  She has been taking azithromycin, prednisone, and using her inhaler with no significant relief.  She denies fever but has had cold chills.  She reports that sometimes her coughing fits are so severe that she has nausea and has had a feel episodes of nonbloody post tussive nonbilious emesis.  No personal or family history of blood clots, recent travel immobilization, leg pain or swelling, hemoptysis, exogenous hormones.  Past Medical History:  Diagnosis Date  . Asthma   . Eczema   . Obesity affecting pregnancy     Patient Active Problem List   Diagnosis Date Noted  . MDD (major depressive disorder), recurrent episode, moderate (HCC) 08/29/2017  . GAD (generalized anxiety disorder) 08/29/2017  . Labor and delivery, indication for care 12/19/2014  . Hypertension affecting pregnancy 12/18/2014    Past Surgical History:  Procedure Laterality Date  . NO PAST SURGERIES      Prior to Admission medications   Medication Sig Start Date End Date Taking? Authorizing Provider  albuterol (PROVENTIL HFA;VENTOLIN HFA) 108 (90 Base) MCG/ACT inhaler Inhale 2 puffs into  the lungs every 6 (six) hours as needed. 11/30/17   Rebecka Apley, MD  albuterol (PROVENTIL HFA;VENTOLIN HFA) 108 (90 Base) MCG/ACT inhaler Inhale 2 puffs into the lungs every 6 (six) hours as needed for wheezing or shortness of breath. 12/03/17   Nita Sickle, MD  amoxicillin-clavulanate (AUGMENTIN) 875-125 MG tablet Take 1 tablet by mouth 2 (two) times daily for 10 days. 12/03/17 12/13/17  Nita Sickle, MD  azithromycin (ZITHROMAX Z-PAK) 250 MG tablet Take 2 tablets (500 mg) on  Day 1,  followed by 1 tablet (250 mg) once daily on Days 2 through 5. 11/30/17 12/05/17  Rebecka Apley, MD  benzonatate (TESSALON PERLES) 100 MG capsule Take 1 capsule (100 mg total) by mouth 3 (three) times daily as needed for cough. 12/03/17 12/03/18  Nita Sickle, MD  divalproex (DEPAKOTE ER) 500 MG 24 hr tablet Take 1 tablet (500 mg total) by mouth daily. 11/26/17   Oletta Darter, MD  escitalopram (LEXAPRO) 20 MG tablet Take 1 tablet (20 mg total) by mouth daily. 11/26/17 11/26/18  Oletta Darter, MD  etonogestrel (NEXPLANON) 68 MG IMPL implant 1 each by Subdermal route once.    [provider]  ibuprofen (ADVIL,MOTRIN) 600 MG tablet Take 1 tablet (600 mg total) by mouth every 8 (eight) hours as needed. Patient not taking: Reported on 11/26/2017 10/08/17   Joni Reining, PA-C  oxyCODONE-acetaminophen (PERCOCET) 7.5-325 MG tablet Take 1 tablet by mouth every 6 (six) hours as needed for severe pain. Patient not taking: Reported on 11/26/2017 10/08/17  Joni ReiningSmith, Ronald K, PA-C  predniSONE (DELTASONE) 20 MG tablet Take 3 tablets (60 mg total) by mouth daily for 4 days. 12/03/17 12/07/17  Nita SickleVeronese, Nichols, MD  sulfamethoxazole-trimethoprim (BACTRIM DS,SEPTRA DS) 800-160 MG tablet Take 1 tablet by mouth 2 (two) times daily. Patient not taking: Reported on 11/26/2017 10/08/17   Joni ReiningSmith, Ronald K, PA-C    Allergies Patient has no known allergies.  Family History  Problem Relation Age of Onset  . Arthritis  Father   . Drug abuse Father   . Bipolar disorder Mother     Social History Social History   Tobacco Use  . Smoking status: Current Every Day Smoker    Packs/day: 1.00    Last attempt to quit: 05/19/2014    Years since quitting: 3.5  . Smokeless tobacco: Never Used  Substance Use Topics  . Alcohol use: Yes    Comment: rare- 2 mixed drinks a month  . Drug use: No    Review of Systems  Constitutional: Negative for fever. + chills Eyes: Negative for visual changes. ENT: Negative for sore throat. Neck: No neck pain  Cardiovascular: + chest pain. Respiratory: + shortness of breath, cough Gastrointestinal: Negative for abdominal pain, or diarrhea. + N/V Genitourinary: Negative for dysuria. Musculoskeletal: Negative for back pain. Skin: Negative for rash. Neurological: Negative for headaches, weakness or numbness. Psych: No SI or HI  ____________________________________________   PHYSICAL EXAM:  VITAL SIGNS: ED Triage Vitals  Enc Vitals Group     BP 12/03/17 1913 (!) 132/108     Pulse Rate 12/03/17 1913 99     Resp 12/03/17 1913 (!) 22     Temp 12/03/17 1913 98.7 F (37.1 C)     Temp Source 12/03/17 1913 Oral     SpO2 12/03/17 1913 99 %     Weight 12/03/17 1914 231 lb 7.7 oz (105 kg)     Height --      Head Circumference --      Peak Flow --      Pain Score 12/03/17 1914 0     Pain Loc --      Pain Edu? --      Excl. in GC? --     Constitutional: Alert and oriented. Well appearing and in no apparent distress. HEENT:      Head: Normocephalic and atraumatic.         Eyes: Conjunctivae are normal. Sclera is non-icteric.       Mouth/Throat: Mucous membranes are moist.       Neck: Supple with no signs of meningismus. Cardiovascular: Regular rate and rhythm. No murmurs, gallops, or rubs. 2+ symmetrical distal pulses are present in all extremities. No JVD. Respiratory: Patient with very severe coughing fits, decreased air movement bilaterally with expiratory  wheezes, no hypoxia .  Gastrointestinal: Soft, non tender, and non distended with positive bowel sounds. No rebound or guarding. Musculoskeletal: Nontender with normal range of motion in all extremities. No edema, cyanosis, or erythema of extremities. Neurologic: Normal speech and language. Face is symmetric. Moving all extremities. No gross focal neurologic deficits are appreciated. Skin: Skin is warm, dry and intact. No rash noted. Psychiatric: Mood and affect are normal. Speech and behavior are normal.  ____________________________________________   LABS (all labs ordered are listed, but only abnormal results are displayed)  Labs Reviewed  CBC WITH DIFFERENTIAL/PLATELET - Abnormal; Notable for the following components:      Result Value   WBC 14.6 (*)    Neutro Abs 10.4 (*)  Monocytes Absolute 1.4 (*)    All other components within normal limits  BASIC METABOLIC PANEL - Abnormal; Notable for the following components:   Calcium 8.5 (*)    All other components within normal limits  TROPONIN I   ____________________________________________  EKG  ED ECG REPORT I, Nita Sicklearolina Yenni Carra, the attending physician, personally viewed and interpreted this ECG.  Normal sinus rhythm, rate of 80, normal intervals, normal axis, no ST elevations or depressions.  Normal EKG. ____________________________________________  RADIOLOGY  I have personally reviewed the images performed during this visit and I agree with the Radiologist's read.   Interpretation by Radiologist:  Dg Chest 2 View  Result Date: 12/03/2017 CLINICAL DATA:  Cough EXAM: CHEST - 2 VIEW COMPARISON:  11/30/2017 FINDINGS: Heart and mediastinal contours are within normal limits. No focal opacities or effusions. No acute bony abnormality. IMPRESSION: No active cardiopulmonary disease. Electronically Signed   By: Charlett NoseKevin  Dover M.D.   On: 12/03/2017 19:48      ____________________________________________   PROCEDURES  Procedure(s) performed: None Procedures Critical Care performed:  None ____________________________________________   INITIAL IMPRESSION / ASSESSMENT AND PLAN / ED COURSE  32 y.o. female with a history of asthma and smoking who presents for evaluation of shortness of breath and cough.  Patient with increased work of breathing, normal sats, afebrile, no tachycardia, decreased air movement bilaterally with diffuse expiratory wheezes concerning for an asthma exacerbation versus pneumonia versus viral URI.  Chest x-ray showing no infiltrate.  EKG showing no evidence of ischemia or pericarditis. Labs pending. Will give duoneb x 3, solumedrol, and reassess    _________________________ 9:23 PM on 12/03/2017 -----------------------------------------  Patient's coughing and respiratory status are markedly improved after 3 DuoNeb's, she is moving good air.  Normal sats.  Chest x-ray showing no evidence of pneumonia.  White cells are trending down.  Will switch patient to Augmentin, give her an extra 4-day course of prednisone.  Recommend close follow-up with primary care doctor.  Discussed return precautions.  Will also provide patient with Tessalon Perles for coughing.   As part of my medical decision making, I reviewed the following data within the electronic MEDICAL RECORD NUMBER Nursing notes reviewed and incorporated, Labs reviewed , EKG interpreted , Old chart reviewed, Radiograph reviewed , Notes from prior ED visits and [place] Controlled Substance Database    Pertinent labs & imaging results that were available during my care of the patient were reviewed by me and considered in my medical decision making (see chart for details).    ____________________________________________   FINAL CLINICAL IMPRESSION(S) / ED DIAGNOSES  Final diagnoses:  Acute bronchitis, unspecified organism      NEW MEDICATIONS STARTED DURING THIS  VISIT:  ED Discharge Orders         Ordered    benzonatate (TESSALON PERLES) 100 MG capsule  3 times daily PRN     12/03/17 2123    predniSONE (DELTASONE) 20 MG tablet  Daily     12/03/17 2123    amoxicillin-clavulanate (AUGMENTIN) 875-125 MG tablet  2 times daily     12/03/17 2123    albuterol (PROVENTIL HFA;VENTOLIN HFA) 108 (90 Base) MCG/ACT inhaler  Every 6 hours PRN     12/03/17 2123           Note:  This document was prepared using Dragon voice recognition software and may include unintentional dictation errors.    Nita SickleVeronese, [place], MD 12/03/17 2125

## 2017-12-03 NOTE — ED Triage Notes (Signed)
Patient c/o cough. Patient dx with bronchitis, on prednisone and azithromycin. Patient denies relief of symptoms. Patient is on day 5 of antibiotics.

## 2017-12-31 ENCOUNTER — Ambulatory Visit (HOSPITAL_COMMUNITY): Payer: Self-pay | Admitting: Psychiatry
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
## 2018-02-11 ENCOUNTER — Ambulatory Visit (HOSPITAL_COMMUNITY): Payer: Self-pay | Admitting: Psychiatry

## 2018-09-29 ENCOUNTER — Encounter: Payer: Self-pay | Admitting: Emergency Medicine

## 2018-09-29 ENCOUNTER — Emergency Department
Admission: EM | Admit: 2018-09-29 | Discharge: 2018-09-29 | Disposition: A | Discharge status: Home / Self Care | Payer: Self-pay | Attending: Emergency Medicine | Admitting: Emergency Medicine

## 2018-09-29 ENCOUNTER — Other Ambulatory Visit: Payer: Self-pay

## 2018-09-29 DIAGNOSIS — L0390 Cellulitis, unspecified: Secondary | ICD-10-CM | POA: Insufficient documentation

## 2018-09-29 DIAGNOSIS — F17210 Nicotine dependence, cigarettes, uncomplicated: Secondary | ICD-10-CM | POA: Insufficient documentation

## 2018-09-29 DIAGNOSIS — L309 Dermatitis, unspecified: Secondary | ICD-10-CM

## 2018-09-29 DIAGNOSIS — L259 Unspecified contact dermatitis, unspecified cause: Secondary | ICD-10-CM | POA: Insufficient documentation

## 2018-09-29 DIAGNOSIS — L02412 Cutaneous abscess of left axilla: Secondary | ICD-10-CM

## 2018-09-29 MED ORDER — TRAMADOL HCL 50 MG PO TABS: 50.0000 mg | ORAL_TABLET | Freq: Two times a day (BID) | ORAL | 0 refills | Status: AC | PRN

## 2018-09-29 MED ORDER — SULFAMETHOXAZOLE-TRIMETHOPRIM 800-160 MG PO TABS: 1.0000 | ORAL_TABLET | Freq: Two times a day (BID) | ORAL | 0 refills | Status: AC

## 2018-09-29 NOTE — ED Notes (Signed)
Pt discharged home after verbalizing understanding of discharge instructions; nad noted. 

## 2018-09-29 NOTE — ED Notes (Signed)
Pt presents with abscess in the fold of her leg. She states that she has multiple abscesses and that she has been battling this for over a year. She received a referral last time she was seen but was not able to afford the follow up care. Pt alert & oriented with NAD noted.

## 2018-09-29 NOTE — ED Triage Notes (Signed)
Patient ambulatory to triage with steady gait, without difficulty or distress noted, mask in place; pt reports abscess to left axillae and left groin; st hx of same due to "eczema"

## 2018-09-29 NOTE — Discharge Instructions (Signed)
Avoid shaving over axillary area for 2 weeks.  Use antibacterial soap as directed.

## 2018-09-29 NOTE — ED Provider Notes (Addendum)
[facility] EMERGENCY DEPARTMENT Provider Note   CSN: [id number] Arrival date & time: 09/29/18  0636    History   Chief Complaint Chief Complaint  Patient presents with  . Abscess    HPI Laurie Stewart is a 33 y.o. female.  Patient complain of recurrent staph infections mostly in the axillary and medial thigh fold area.  Patient has a history of eczema.  Patient denies fever associated with complaint.  Patient denies any drainage.  Patient rates the pain is 6/10.  Patient described the pain is "sore".  No palliative measures for complaint.  Patient is morbidly obese.     HPI  Past Medical History:  Diagnosis Date  . Asthma   . Eczema   . Obesity affecting pregnancy     Patient Active Problem List   Diagnosis Date Noted  . MDD (major depressive disorder), recurrent episode, moderate ([facility]) 08/29/2017  . GAD (generalized anxiety disorder) 08/29/2017  . Labor and delivery, indication for care 12/19/2014  . Hypertension affecting pregnancy 12/18/2014    Past Surgical History:  Procedure Laterality Date  . NO PAST SURGERIES       OB History    Gravida  2   Para  2   Term  2   Preterm  0   AB  0   Living  2     SAB  0   TAB  0   Ectopic  0   Multiple  0   Live Births  2            Home Medications    Prior to Admission medications   Medication Sig Start Date End Date Taking? Authorizing Provider  albuterol (PROVENTIL HFA;VENTOLIN HFA) 108 (90 Base) MCG/ACT inhaler Inhale 2 puffs into the lungs every 6 (six) hours as needed. 11/30/17   Loney Hering, MD  albuterol (PROVENTIL HFA;VENTOLIN HFA) 108 (90 Base) MCG/ACT inhaler Inhale 2 puffs into the lungs every 6 (six) hours as needed for wheezing or shortness of breath. 12/03/17   Rudene Re, MD  benzonatate (TESSALON PERLES) 100 MG capsule Take 1 capsule (100 mg total) by mouth 3 (three) times daily as needed for cough. 12/03/17 12/03/18  Rudene Re, MD   divalproex (DEPAKOTE ER) 500 MG 24 hr tablet Take 1 tablet (500 mg total) by mouth daily. 11/26/17   Charlcie Cradle, MD  escitalopram (LEXAPRO) 20 MG tablet Take 1 tablet (20 mg total) by mouth daily. 11/26/17 11/26/18  Charlcie Cradle, MD  etonogestrel (NEXPLANON) 68 MG IMPL implant 1 each by Subdermal route once.    [provider]  ibuprofen (ADVIL,MOTRIN) 600 MG tablet Take 1 tablet (600 mg total) by mouth every 8 (eight) hours as needed. Patient not taking: Reported on 11/26/2017 10/08/17   Sable Feil, PA-C  oxyCODONE-acetaminophen (PERCOCET) 7.5-325 MG tablet Take 1 tablet by mouth every 6 (six) hours as needed for severe pain. Patient not taking: Reported on 11/26/2017 10/08/17   Sable Feil, PA-C  sulfamethoxazole-trimethoprim (BACTRIM DS,SEPTRA DS) 800-160 MG tablet Take 1 tablet by mouth 2 (two) times daily. Patient not taking: Reported on 11/26/2017 10/08/17   Sable Feil, PA-C    Family History Family History  Problem Relation Age of Onset  . Arthritis Father   . Drug abuse Father   . Bipolar disorder Mother     Social History Social History   Tobacco Use  . Smoking status: Current Every Day Smoker    Packs/day: 1.00  Last attempt to quit: 05/19/2014    Years since quitting: 4.3  . Smokeless tobacco: Never Used  Substance Use Topics  . Alcohol use: Yes    Comment: rare- 2 mixed drinks a month  . Drug use: No     Allergies   Patient has no known allergies.   Review of Systems Review of Systems   Physical Exam Updated Vital Signs BP (!) 140/91 (BP Location: Left Arm)   Pulse (!) 104   Temp 98.1 F (36.7 C) (Oral)   Resp 20   Ht 5\' 8"  (1.727 m)   Wt 131.5 kg   LMP 09/15/2018 (Exact Date)   SpO2 100%   BMI 44.09 kg/m   Physical Exam Patient is multiple pustular lesions on erythematous base affecting the bilateral axillary and mid thigh fold area.  No purulent drainage.  ED Treatments / Results  Labs (all labs ordered are listed, but only  abnormal results are displayed) Labs Reviewed - No data to display  EKG None  Radiology No results found.  Procedures Procedures (including critical care time)  Medications Ordered in ED Medications - No data to display   Initial Impression / Assessment and Plan / ED Course  I have reviewed the triage vital signs and the nursing notes.  Pertinent labs & imaging results that were available during my care of the patient were reviewed by me and considered in my medical decision making (see chart for details).        Patient presents for multiple pustular lesion secondary to eczema.  Patient given discharge care instructions advised take medication as directed.  Patient advised follow-up with dermatology for definitive evaluation and treatment.  Final Clinical Impressions(s) / ED Diagnoses   Final diagnoses:  None   Cellulitis secondary to atypical dermatitis. ED Discharge Orders    None       Joni ReiningSmith,  K, PA-C 09/29/18 0810    Joni ReiningSmith,  K, PA-C 09/29/18 [id number]    Dionne BucySiadecki, Sebastian, MD 09/29/18 [phone]

## 2018-11-09 ENCOUNTER — Other Ambulatory Visit: Payer: Self-pay

## 2018-11-09 ENCOUNTER — Emergency Department
Admission: EM | Admit: 2018-11-09 | Discharge: 2018-11-09 | Disposition: A | Discharge status: Home / Self Care | Payer: Self-pay | Attending: Emergency Medicine | Admitting: Emergency Medicine

## 2018-11-09 ENCOUNTER — Encounter: Payer: Self-pay | Admitting: Emergency Medicine

## 2018-11-09 DIAGNOSIS — Y999 Unspecified external cause status: Secondary | ICD-10-CM | POA: Insufficient documentation

## 2018-11-09 DIAGNOSIS — R51 Headache: Secondary | ICD-10-CM | POA: Insufficient documentation

## 2018-11-09 DIAGNOSIS — F17210 Nicotine dependence, cigarettes, uncomplicated: Secondary | ICD-10-CM | POA: Insufficient documentation

## 2018-11-09 DIAGNOSIS — J45909 Unspecified asthma, uncomplicated: Secondary | ICD-10-CM | POA: Insufficient documentation

## 2018-11-09 DIAGNOSIS — X58XXXA Exposure to other specified factors, initial encounter: Secondary | ICD-10-CM | POA: Insufficient documentation

## 2018-11-09 DIAGNOSIS — Y9389 Activity, other specified: Secondary | ICD-10-CM | POA: Insufficient documentation

## 2018-11-09 DIAGNOSIS — S0501XA Injury of conjunctiva and corneal abrasion without foreign body, right eye, initial encounter: Secondary | ICD-10-CM | POA: Insufficient documentation

## 2018-11-09 DIAGNOSIS — Y9289 Other specified places as the place of occurrence of the external cause: Secondary | ICD-10-CM | POA: Insufficient documentation

## 2018-11-09 MED ORDER — FLUORESCEIN SODIUM 1 MG OP STRP
ORAL_STRIP | OPHTHALMIC | Status: AC
  Administered 2018-11-09: 08:00:00
  Filled 2018-11-09: qty 1

## 2018-11-09 MED ORDER — BUTALBITAL-APAP-CAFFEINE 50-325-40 MG PO TABS
2.0000 | ORAL_TABLET | Freq: Once | ORAL | Status: AC
  Administered 2018-11-09: 08:00:00 2 via ORAL
  Filled 2018-11-09: qty 2

## 2018-11-09 MED ORDER — ERYTHROMYCIN 5 MG/GM OP OINT: 1.0000 "application " | TOPICAL_OINTMENT | Freq: Every day | OPHTHALMIC | 1 refills | Status: AC

## 2018-11-09 MED ORDER — ERYTHROMYCIN 5 MG/GM OP OINT
TOPICAL_OINTMENT | Freq: Once | OPHTHALMIC | Status: AC
  Administered 2018-11-09: 1 via OPHTHALMIC
  Filled 2018-11-09: qty 1

## 2018-11-09 MED ORDER — BUTALBITAL-APAP-CAFFEINE 50-325-40 MG PO TABS: 1.0000 | ORAL_TABLET | Freq: Four times a day (QID) | ORAL | 0 refills | Status: AC | PRN
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
## 2018-11-09 NOTE — ED Notes (Signed)
Patient ambulated to hallway restroom.

## 2018-11-09 NOTE — ED Triage Notes (Signed)
Patient ambulatory to triage with steady gait, without difficulty or distress noted, mask in place; pt reports since yesterday having rt sided HA with pain behind rt eye and blurring of vision

## 2019-02-04 ENCOUNTER — Other Ambulatory Visit: Payer: Self-pay

## 2019-02-04 ENCOUNTER — Encounter: Payer: Self-pay | Admitting: Emergency Medicine

## 2019-02-04 DIAGNOSIS — Y999 Unspecified external cause status: Secondary | ICD-10-CM | POA: Insufficient documentation

## 2019-02-04 DIAGNOSIS — F17210 Nicotine dependence, cigarettes, uncomplicated: Secondary | ICD-10-CM | POA: Insufficient documentation

## 2019-02-04 DIAGNOSIS — X509XXA Other and unspecified overexertion or strenuous movements or postures, initial encounter: Secondary | ICD-10-CM | POA: Insufficient documentation

## 2019-02-04 DIAGNOSIS — Z79899 Other long term (current) drug therapy: Secondary | ICD-10-CM | POA: Insufficient documentation

## 2019-02-04 DIAGNOSIS — Y93A9 Activity, other involving cardiorespiratory exercise: Secondary | ICD-10-CM | POA: Insufficient documentation

## 2019-02-04 DIAGNOSIS — J45909 Unspecified asthma, uncomplicated: Secondary | ICD-10-CM | POA: Insufficient documentation

## 2019-02-04 DIAGNOSIS — S39012A Strain of muscle, fascia and tendon of lower back, initial encounter: Secondary | ICD-10-CM | POA: Insufficient documentation

## 2019-02-04 DIAGNOSIS — Y929 Unspecified place or not applicable: Secondary | ICD-10-CM | POA: Insufficient documentation

## 2019-02-04 NOTE — ED Triage Notes (Signed)
Pt arrives POV to triage with c/o back pain due to pulling a muscle x2 days ago. Pt states that she has tried everything at home that she can without relief. 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 Pt is in NAD.

## 2019-02-05 ENCOUNTER — Emergency Department
Admission: EM | Admit: 2019-02-05 | Discharge: 2019-02-05 | Disposition: A | Discharge status: Home / Self Care | Payer: Self-pay | Attending: Emergency Medicine | Admitting: Emergency Medicine

## 2019-02-05 DIAGNOSIS — S39012A Strain of muscle, fascia and tendon of lower back, initial encounter: Secondary | ICD-10-CM

## 2019-02-05 MED ORDER — DIAZEPAM 2 MG PO TABS: 2.0000 mg | ORAL_TABLET | Freq: Three times a day (TID) | ORAL | 0 refills | Status: AC | PRN

## 2019-02-05 MED ORDER — KETOROLAC TROMETHAMINE 30 MG/ML IJ SOLN
30.0000 mg | Freq: Once | INTRAMUSCULAR | Status: AC
  Administered 2019-02-05: 03:00:00 30 mg via INTRAMUSCULAR
  Filled 2019-02-05: qty 1

## 2019-02-05 MED ORDER — HYDROCODONE-ACETAMINOPHEN 5-325 MG PO TABS
1.0000 | ORAL_TABLET | Freq: Once | ORAL | Status: AC
  Administered 2019-02-05: 1 via ORAL
  Filled 2019-02-05: qty 1

## 2019-02-05 MED ORDER — DIAZEPAM 2 MG PO TABS
2.0000 mg | ORAL_TABLET | Freq: Once | ORAL | Status: AC
  Administered 2019-02-05: 2 mg via ORAL
  Filled 2019-02-05: qty 1

## 2019-02-05 MED ORDER — HYDROCODONE-ACETAMINOPHEN 5-325 MG PO TABS: 1.0000 | ORAL_TABLET | Freq: Four times a day (QID) | ORAL | 0 refills | Status: AC | PRN

## 2019-02-05 NOTE — ED Notes (Signed)
Pt updated on wait. Pt verbalizes understanding.  

## 2019-02-05 NOTE — Discharge Instructions (Addendum)
1.  Continue Ibuprofen 3 times daily. 2.  You may take medicines as needed for pain and muscle relaxation (Norco/Valium #15). 3.  Apply moist heat to affected area several times daily. 4.  Return to the ER for worsening symptoms, persistent vomiting, difficulty breathing or other concerns.

## 2019-02-05 NOTE — ED Provider Notes (Signed)
[facility] Emergency Department Provider Note   ____________________________________________   First MD Initiated Contact with Patient 02/05/19 [phone]     (approximate)  I have reviewed the triage vital signs and the nursing notes.   HISTORY  Chief Complaint Back Pain    HPI Laurie Stewart is a 33 y.o. female who presents to the ED from home with a chief complaint of low back pain.  Patient started a new exercise regimen 2 days ago.  Subsequently reached up for a cabinet and tweaked her back.  Complains of right lower back pain with spasms.  Taking ibuprofen without relief of symptoms.  Denies extremity radiation, weakness, numbness or tingling.  Denies bowel or bladder incontinence.  Voices no other medical complaints.       Past Medical History:  Diagnosis Date  . Asthma   . Eczema   . Obesity affecting pregnancy     Patient Active Problem List   Diagnosis Date Noted  . MDD (major depressive disorder), recurrent episode, moderate ([place]) 08/29/2017  . GAD (generalized anxiety disorder) 08/29/2017  . Labor and delivery, indication for care 12/19/2014  . Hypertension affecting pregnancy 12/18/2014    Past Surgical History:  Procedure Laterality Date  . NO PAST SURGERIES      Prior to Admission medications   Medication Sig Start Date End Date Taking? Authorizing Provider  albuterol (PROVENTIL HFA;VENTOLIN HFA) 108 (90 Base) MCG/ACT inhaler Inhale 2 puffs into the lungs every 6 (six) hours as needed. 11/30/17   Loney Hering, MD  albuterol (PROVENTIL HFA;VENTOLIN HFA) 108 (90 Base) MCG/ACT inhaler Inhale 2 puffs into the lungs every 6 (six) hours as needed for wheezing or shortness of breath. 12/03/17   Rudene Re, MD  butalbital-acetaminophen-caffeine [facility]) [phone] MG tablet Take 1-2 tablets by mouth every 6 (six) hours as needed for headache. 11/09/18 11/09/19  Harvest Dark, MD  diazepam (VALIUM) 2 MG tablet Take 1 tablet (2  mg total) by mouth every 8 (eight) hours as needed for muscle spasms. 02/05/19   Paulette Blanch, MD  divalproex (DEPAKOTE ER) 500 MG 24 hr tablet Take 1 tablet (500 mg total) by mouth daily. 11/26/17   Charlcie Cradle, MD  escitalopram (LEXAPRO) 20 MG tablet Take 1 tablet (20 mg total) by mouth daily. 11/26/17 11/26/18  Charlcie Cradle, MD  etonogestrel (NEXPLANON) 68 MG IMPL implant 1 each by Subdermal route once.    [provider]  HYDROcodone-acetaminophen (NORCO) 5-325 MG tablet Take 1 tablet by mouth every 6 (six) hours as needed for moderate pain. 02/05/19   Paulette Blanch, MD  ibuprofen (ADVIL,MOTRIN) 600 MG tablet Take 1 tablet (600 mg total) by mouth every 8 (eight) hours as needed. Patient not taking: Reported on 11/26/2017 10/08/17   Sable Feil, PA-C  oxyCODONE-acetaminophen (PERCOCET) 7.5-325 MG tablet Take 1 tablet by mouth every 6 (six) hours as needed for severe pain. Patient not taking: Reported on 11/26/2017 10/08/17   Sable Feil, PA-C  sulfamethoxazole-trimethoprim (BACTRIM DS) 800-160 MG tablet Take 1 tablet by mouth 2 (two) times daily. 09/29/18   Sable Feil, PA-C  sulfamethoxazole-trimethoprim (BACTRIM DS,SEPTRA DS) 800-160 MG tablet Take 1 tablet by mouth 2 (two) times daily. Patient not taking: Reported on 11/26/2017 10/08/17   Sable Feil, PA-C  traMADol (ULTRAM) 50 MG tablet Take 1 tablet (50 mg total) by mouth every 12 (twelve) hours as needed. 09/29/18   Sable Feil, PA-C    Allergies Patient has no  known allergies.  Family History  Problem Relation Age of Onset  . Arthritis Father   . Drug abuse Father   . Bipolar disorder Mother     Social History Social History   Tobacco Use  . Smoking status: Current Every Day Smoker    Packs/day: 1.00    Last attempt to quit: 05/19/2014    Years since quitting: 4.7  . Smokeless tobacco: Never Used  Substance Use Topics  . Alcohol use: Yes    Comment: rare- 2 mixed drinks a month  . Drug use: No     Review of Systems  Constitutional: No fever/chills Eyes: No visual changes. ENT: No sore throat. Cardiovascular: Denies chest pain. Respiratory: Denies shortness of breath. Gastrointestinal: No abdominal pain.  No nausea, no vomiting.  No diarrhea.  No constipation. Genitourinary: Negative for dysuria. Musculoskeletal: Positive for back pain. Skin: Negative for rash. Neurological: Negative for headaches, focal weakness or numbness.   ____________________________________________   PHYSICAL EXAM:  VITAL SIGNS: ED Triage Vitals  Enc Vitals Group     BP 02/04/19 2308 (!) 149/76     Pulse Rate 02/04/19 2308 87     Resp 02/04/19 2308 18     Temp 02/04/19 2308 98.3 F (36.8 C)     Temp Source 02/04/19 2308 Oral     SpO2 02/04/19 2308 96 %     Weight 02/04/19 2308 290 lb (131.5 kg)     Height 02/04/19 2308 5\' 9"  (1.753 m)     Head Circumference --      Peak Flow --      Pain Score 02/04/19 2315 10     Pain Loc --      Pain Edu? --      Excl. in GC? --     Constitutional: Alert and oriented. Well appearing and in no acute distress. Eyes: Conjunctivae are normal. PERRL. EOMI. Head: Atraumatic. Nose: No congestion/rhinnorhea. Mouth/Throat: Mucous membranes are moist.  Oropharynx non-erythematous. Neck: No stridor.   Cardiovascular: Normal rate, regular rhythm. Grossly normal heart sounds.  Good peripheral circulation. Respiratory: Normal respiratory effort.  No retractions. Lungs CTAB. Gastrointestinal: Soft and nontender. No distention. No abdominal bruits. No CVA tenderness. Musculoskeletal: No spinal tenderness to palpation.  Right paraspinal muscle spasms.  Right buttock tender to palpation.  Negative straight leg raise.  No lower extremity tenderness nor edema.  No joint effusions. Neurologic:  Normal speech and language. No gross focal neurologic deficits are appreciated. No gait instability. Skin:  Skin is warm, dry and intact. No rash noted. Psychiatric: Mood and  affect are normal. Speech and behavior are normal.  ____________________________________________   LABS (all labs ordered are listed, but only abnormal results are displayed)  Labs Reviewed - No data to display ____________________________________________  EKG  None ____________________________________________  RADIOLOGY  ED MD interpretation: None  Official radiology report(s): No results found.  ____________________________________________   PROCEDURES  Procedure(s) performed (including Critical Care):  Procedures   ____________________________________________   INITIAL IMPRESSION / ASSESSMENT AND PLAN / ED COURSE  As part of my medical decision making, I reviewed the following data within the electronic MEDICAL RECORD NUMBER Nursing notes reviewed and incorporated, Old chart reviewed, Notes from prior ED visits and [place] Controlled Substance Database     Laurie Stewart was evaluated in Emergency Department on 02/05/2019 for the symptoms described in the history of present illness. She was evaluated in the context of the global COVID-19 pandemic, which necessitated consideration that the patient might be  at risk for infection with the SARS-CoV-2 virus that causes COVID-19. Institutional protocols and algorithms that pertain to the evaluation of patients at risk for COVID-19 are in a state of rapid change based on information released by regulatory bodies including the CDC and federal and state organizations. These policies and algorithms were followed during the patient's care in the ED.    33 year old female who presents with lumbar strain.  No focal neurological deficits noted.  Encouraged her to continue NSAIDs; will place on limited analgesia and muscle relaxer as needed.  Strict return precautions given.  Patient verbalizes understanding agrees with plan of care.      ____________________________________________   FINAL CLINICAL IMPRESSION(S) / ED DIAGNOSES  Final  diagnoses:  Strain of lumbar region, initial encounter     ED Discharge Orders         Ordered    HYDROcodone-acetaminophen (NORCO) 5-325 MG tablet  Every 6 hours PRN     02/05/19 0250    diazepam (VALIUM) 2 MG tablet  Every 8 hours PRN     02/05/19 0250           Note:  This document was prepared using Dragon voice recognition software and may include unintentional dictation errors.   Irean Hong, MD 02/05/19 [phone]

## 2019-08-04 ENCOUNTER — Ambulatory Visit: Payer: Medicaid Other

## 2019-08-08 ENCOUNTER — Ambulatory Visit: Payer: Medicaid Other | Attending: Internal Medicine

## 2019-08-08 ENCOUNTER — Other Ambulatory Visit: Payer: Self-pay

## 2019-08-08 DIAGNOSIS — Z23 Encounter for immunization: Secondary | ICD-10-CM

## 2019-08-08 NOTE — Progress Notes (Signed)
   Covid-19 Vaccination Clinic  Name:  Laurie Stewart    MRN: [id number] DOB: 15-Mar-1986  08/08/2019  [name] was observed post Covid-19 immunization for 15 minutes without incident. She was provided with Vaccine Information Sheet and instruction to access the V-Safe system.   [name] was instructed to call 911 with any severe reactions post vaccine: Marland Kitchen Difficulty breathing  . Swelling of face and throat  . A fast heartbeat  . A bad rash all over body  . Dizziness and weakness   Immunizations Administered    Name Date Dose VIS Date Route   Pfizer COVID-19 Vaccine 08/08/2019  8:28 AM 0.3 mL 06/15/2018 Intramuscular   Manufacturer: [employer], [employer]   Lot: [id number]   NDC: [address]-1

## 2019-08-31 ENCOUNTER — Ambulatory Visit: Payer: Medicaid Other | Attending: Internal Medicine

## 2019-08-31 DIAGNOSIS — Z23 Encounter for immunization: Secondary | ICD-10-CM

## 2019-08-31 NOTE — Progress Notes (Signed)
   Covid-19 Vaccination Clinic  Name:  Laurie Stewart    MRN: [id number] DOB: [birth year]  08/31/2019  [name] was observed post Covid-19 immunization for 15 minutes without incident. She was provided with Vaccine Information Sheet and instruction to access the V-Safe system.   [name] was instructed to call 911 with any severe reactions post vaccine: Marland Kitchen Difficulty breathing  . Swelling of face and throat  . A fast heartbeat  . A bad rash all over body  . Dizziness and weakness   Immunizations Administered    Name Date Dose VIS Date Route   Pfizer COVID-19 Vaccine 08/31/2019  8:44 AM 0.3 mL 06/15/2018 Intramuscular   Manufacturer: [employer], [employer]   Lot: [id number]   NDC: [address]-3

## 2020-06-11 ENCOUNTER — Other Ambulatory Visit
Admission: RE | Admit: 2020-06-11 | Discharge: 2020-06-11 | Disposition: A | Discharge status: Home / Self Care | Payer: No Typology Code available for payment source | Source: Ambulatory Visit | Attending: Physician Assistant | Admitting: Physician Assistant

## 2020-06-11 ENCOUNTER — Other Ambulatory Visit: Payer: Self-pay | Admitting: Physician Assistant

## 2020-06-11 ENCOUNTER — Other Ambulatory Visit (HOSPITAL_COMMUNITY): Payer: Self-pay | Admitting: Physician Assistant

## 2020-06-11 DIAGNOSIS — R0789 Other chest pain: Secondary | ICD-10-CM | POA: Diagnosis not present

## 2020-06-11 LAB — D-DIMER, QUANTITATIVE: D-Dimer, Quant: 1.75 ug/mL-FEU — ABNORMAL HIGH (ref 0.00–0.50)

## 2020-06-12 ENCOUNTER — Ambulatory Visit
Admission: RE | Admit: 2020-06-12 | Discharge: 2020-06-12 | Disposition: A | Discharge status: Home / Self Care | Payer: No Typology Code available for payment source | Source: Ambulatory Visit | Attending: Physician Assistant | Admitting: Physician Assistant

## 2020-06-12 ENCOUNTER — Other Ambulatory Visit: Payer: Self-pay

## 2020-06-12 DIAGNOSIS — R0789 Other chest pain: Secondary | ICD-10-CM | POA: Diagnosis present

## 2020-06-12 MED ORDER — IOHEXOL 350 MG/ML SOLN
75.0000 mL | Freq: Once | INTRAVENOUS | Status: AC | PRN
  Administered 2020-06-12: 75 mL via INTRAVENOUS
